# Patient Record
Sex: Female | Born: 1950 | Race: White | Hispanic: No | Smoking: Never smoker
Health system: Southern US, Community
[De-identification: ages and names within clinical notes are randomized; demographics above are authoritative.]

## PROBLEM LIST (undated history)

## (undated) DIAGNOSIS — T8859XA Other complications of anesthesia, initial encounter: Secondary | ICD-10-CM

## (undated) DIAGNOSIS — G629 Polyneuropathy, unspecified: Secondary | ICD-10-CM

## (undated) DIAGNOSIS — E119 Type 2 diabetes mellitus without complications: Secondary | ICD-10-CM

## (undated) DIAGNOSIS — M1990 Unspecified osteoarthritis, unspecified site: Secondary | ICD-10-CM

## (undated) DIAGNOSIS — T4145XA Adverse effect of unspecified anesthetic, initial encounter: Secondary | ICD-10-CM

## (undated) DIAGNOSIS — I10 Essential (primary) hypertension: Secondary | ICD-10-CM

## (undated) HISTORY — PX: CHOLECYSTECTOMY: SHX55

## (undated) HISTORY — PX: THUMB ARTHROSCOPY: SHX2509

## (undated) HISTORY — PX: SHOULDER ARTHROSCOPY: SHX128

---

## 2009-06-07 ENCOUNTER — Ambulatory Visit: Payer: Self-pay

## 2017-09-09 ENCOUNTER — Other Ambulatory Visit: Payer: Self-pay

## 2017-09-09 ENCOUNTER — Encounter: Payer: Self-pay | Admitting: *Deleted

## 2017-09-09 ENCOUNTER — Encounter
Admission: RE | Admit: 2017-09-09 | Discharge: 2017-09-09 | Disposition: A | Discharge status: Home / Self Care | Payer: Medicare Other | Source: Ambulatory Visit | Attending: Obstetrics & Gynecology | Admitting: Obstetrics & Gynecology

## 2017-09-09 DIAGNOSIS — R000 Tachycardia, unspecified: Secondary | ICD-10-CM | POA: Diagnosis not present

## 2017-09-09 DIAGNOSIS — Z01810 Encounter for preprocedural cardiovascular examination: Secondary | ICD-10-CM | POA: Insufficient documentation

## 2017-09-09 DIAGNOSIS — E1140 Type 2 diabetes mellitus with diabetic neuropathy, unspecified: Secondary | ICD-10-CM | POA: Diagnosis not present

## 2017-09-09 DIAGNOSIS — I10 Essential (primary) hypertension: Secondary | ICD-10-CM | POA: Insufficient documentation

## 2017-09-09 DIAGNOSIS — Z01812 Encounter for preprocedural laboratory examination: Secondary | ICD-10-CM | POA: Insufficient documentation

## 2017-09-09 DIAGNOSIS — Z87898 Personal history of other specified conditions: Secondary | ICD-10-CM | POA: Diagnosis not present

## 2017-09-09 HISTORY — DX: Unspecified osteoarthritis, unspecified site: M19.90

## 2017-09-09 HISTORY — DX: Type 2 diabetes mellitus without complications: E11.9

## 2017-09-09 HISTORY — DX: Adverse effect of unspecified anesthetic, initial encounter: T41.45XA

## 2017-09-09 HISTORY — DX: Other complications of anesthesia, initial encounter: T88.59XA

## 2017-09-09 HISTORY — DX: Polyneuropathy, unspecified: G62.9

## 2017-09-09 HISTORY — DX: Essential (primary) hypertension: I10

## 2017-09-09 LAB — TYPE AND SCREEN
ABO/RH(D): A POS
ANTIBODY SCREEN: NEGATIVE

## 2017-09-09 LAB — CBC
HCT: 36 % (ref 35.0–47.0)
HEMOGLOBIN: 11.4 g/dL — AB (ref 12.0–16.0)
MCH: 26.5 pg (ref 26.0–34.0)
MCHC: 31.7 g/dL — ABNORMAL LOW (ref 32.0–36.0)
MCV: 83.6 fL (ref 80.0–100.0)
Platelets: 283 10*3/uL (ref 150–440)
RBC: 4.3 MIL/uL (ref 3.80–5.20)
RDW: 14.5 % (ref 11.5–14.5)
WBC: 8.1 10*3/uL (ref 3.6–11.0)

## 2017-09-09 LAB — BASIC METABOLIC PANEL
ANION GAP: 11 (ref 5–15)
BUN: 31 mg/dL — ABNORMAL HIGH (ref 6–20)
CHLORIDE: 103 mmol/L (ref 101–111)
CO2: 25 mmol/L (ref 22–32)
Calcium: 9.5 mg/dL (ref 8.9–10.3)
Creatinine, Ser: 0.98 mg/dL (ref 0.44–1.00)
GFR calc Af Amer: >60 mL/min (ref >60)
GFR calc non Af Amer: 59 mL/min — ABNORMAL LOW (ref >60)
Glucose, Bld: 232 mg/dL — ABNORMAL HIGH (ref 65–99)
POTASSIUM: 4.4 mmol/L (ref 3.5–5.1)
SODIUM: 139 mmol/L (ref 135–145)

## 2017-09-09 NOTE — Patient Instructions (Addendum)
Your procedure is scheduled on: 09/18/17 Wed Report to Same Day Surgery 2nd floor medical mall [facility](Medical Mall Entrance-take elevator on left to 2nd floor.  Check in with surgery information desk.) To find out your arrival time please call [phone] between 1PM - 3PM on 09/17/17 Tues  Remember: Instructions that are not followed completely may result in serious medical risk, up to and including death, or upon the discretion of your surgeon and anesthesiologist your surgery may need to be rescheduled.    _x___ 1. Do not eat food after midnight the night before your procedure. You may drink clear liquids up to 2 hours before you are scheduled to arrive at the hospital for your procedure.  Do not drink clear liquids within 2 hours of your scheduled arrival to the hospital.  Clear liquids include  --Water or Apple juice without pulp  --Clear carbohydrate beverage such as ClearFast or Gatorade  --Black Coffee or Clear Tea (No milk, no creamers, do not add anything to                  the coffee or Tea Type 1 and type 2 diabetics should only drink water.  No gum chewing or hard candies.     __x__ 2. No Alcohol for 24 hours before or after surgery.   __x__3. No Smoking for 24 prior to surgery.   ____  4. Bring all medications with you on the day of surgery if instructed.    __x__ 5. Notify your doctor if there is any change in your medical condition     (cold, fever, infections).     Do not wear jewelry, make-up, hairpins, clips or nail polish.  Do not wear lotions, powders, or perfumes. You may wear deodorant.  Do not shave 48 hours prior to surgery. Men may shave face and neck.  Do not bring valuables to the hospital.    [facility] not [facility] for any belongings or valuables.               Contacts, dentures or bridgework may not be worn into surgery.  Leave your suitcase in the car. After surgery it may be brought to your room.  For patients admitted to the hospital,  discharge time is determined by your                       treatment team.   Patients discharged the day of surgery will not be allowed to drive home.  You will need someone to drive you home and stay with you the night of your procedure.    Please read over the following fact sheets that you were given:   [facility] and or MRSA Information   _x___ Take anti-hypertensive listed below, cardiac, seizure, asthma,     anti-reflux and psychiatric medicines. These include:  1. Omeprazole  2.  3.  4.  5.  6.  ____Fleets enema or Magnesium Citrate as directed.   _x___ Use CHG Soap or sage wipes as directed on instruction sheet   ____ Use inhalers on the day of surgery and bring to hospital day of surgery  _x___ Stop Metformin and Janumet 2 days prior to surgery.    ____ Take 1/2 of usual insulin dose the night before surgery and none on the morning     surgery.   _x___ Follow recommendations from Cardiologist, Pulmonologist or PCP regarding  stopping Aspirin, Coumadin, Plavix ,Eliquis, Effient, or Pradaxa, and Pletal.  X____Stop Anti-inflammatories such as Advil, Aleve, Ibuprofen, Motrin, Naproxen, Naprosyn, Goodies powders or aspirin products. OK to take Tylenol and                          Celebrex.   _x___ Stop supplements until after surgery.  But may continue Vitamin D, Vitamin B,       and multivitamin.   ____ Bring C-Pap to the hospital.

## 2017-09-10 NOTE — Pre-Procedure Instructions (Signed)
EKG CALLED AND FAXED TO [facility]ACE[facility] AT DR Judie PetitM MILLER,S. ALSO CALLED AND FAXED TO BECKY LYNN AT DR C WARD AS INSTRUCTED BY DR [facility] Henrene HawkingKEPHART

## 2017-09-12 NOTE — Pre-Procedure Instructions (Signed)
CLEARED LOW RISK BY DR Paul HalfM MILLER

## 2017-09-18 ENCOUNTER — Encounter
Admission: RE | Disposition: A | Discharge status: Home / Self Care | Payer: Self-pay | Source: Ambulatory Visit | Attending: Obstetrics & Gynecology

## 2017-09-18 ENCOUNTER — Other Ambulatory Visit: Payer: Self-pay

## 2017-09-18 ENCOUNTER — Ambulatory Visit: Payer: Medicare Other | Admitting: Registered Nurse

## 2017-09-18 ENCOUNTER — Ambulatory Visit
Admission: RE | Admit: 2017-09-18 | Discharge: 2017-09-18 | Disposition: A | Discharge status: Home / Self Care | Payer: Medicare Other | Source: Ambulatory Visit | Attending: Obstetrics & Gynecology | Admitting: Obstetrics & Gynecology

## 2017-09-18 DIAGNOSIS — I10 Essential (primary) hypertension: Secondary | ICD-10-CM | POA: Diagnosis not present

## 2017-09-18 DIAGNOSIS — Z888 Allergy status to other drugs, medicaments and biological substances status: Secondary | ICD-10-CM | POA: Insufficient documentation

## 2017-09-18 DIAGNOSIS — N840 Polyp of corpus uteri: Secondary | ICD-10-CM | POA: Insufficient documentation

## 2017-09-18 DIAGNOSIS — Z79899 Other long term (current) drug therapy: Secondary | ICD-10-CM | POA: Diagnosis not present

## 2017-09-18 DIAGNOSIS — E1140 Type 2 diabetes mellitus with diabetic neuropathy, unspecified: Secondary | ICD-10-CM | POA: Insufficient documentation

## 2017-09-18 DIAGNOSIS — N950 Postmenopausal bleeding: Secondary | ICD-10-CM | POA: Diagnosis not present

## 2017-09-18 DIAGNOSIS — Z882 Allergy status to sulfonamides status: Secondary | ICD-10-CM | POA: Insufficient documentation

## 2017-09-18 DIAGNOSIS — Z91040 Latex allergy status: Secondary | ICD-10-CM | POA: Insufficient documentation

## 2017-09-18 HISTORY — PX: HYSTEROSCOPY WITH D & C: SHX1775

## 2017-09-18 LAB — GLUCOSE, CAPILLARY
GLUCOSE-CAPILLARY: 193 mg/dL — AB (ref 65–99)
Glucose-Capillary: 209 mg/dL — ABNORMAL HIGH (ref 65–99)

## 2017-09-18 LAB — ABO/RH: ABO/RH(D): A POS

## 2017-09-18 SURGERY — DILATATION AND CURETTAGE /HYSTEROSCOPY
Anesthesia: General

## 2017-09-18 MED ORDER — DEXAMETHASONE SODIUM PHOSPHATE 10 MG/ML IJ SOLN
INTRAMUSCULAR | Status: DC | PRN
  Administered 2017-09-18: 5 mg via INTRAVENOUS

## 2017-09-18 MED ORDER — LIDOCAINE HCL (CARDIAC) 20 MG/ML IV SOLN
INTRAVENOUS | Status: DC | PRN
  Administered 2017-09-18: 80 mg via INTRAVENOUS

## 2017-09-18 MED ORDER — FENTANYL CITRATE (PF) 100 MCG/2ML IJ SOLN
INTRAMUSCULAR | Status: AC
  Administered 2017-09-18: 50 ug via INTRAVENOUS
  Filled 2017-09-18: qty 2

## 2017-09-18 MED ORDER — PROPOFOL 500 MG/50ML IV EMUL
INTRAVENOUS | Status: DC | PRN
  Administered 2017-09-18: 150 ug/kg/min via INTRAVENOUS

## 2017-09-18 MED ORDER — PROPOFOL 10 MG/ML IV BOLUS
INTRAVENOUS | Status: AC
  Filled 2017-09-18: qty 20

## 2017-09-18 MED ORDER — LIDOCAINE HCL (PF) 2 % IJ SOLN
INTRAMUSCULAR | Status: AC
  Filled 2017-09-18: qty 10

## 2017-09-18 MED ORDER — PROPOFOL 500 MG/50ML IV EMUL
INTRAVENOUS | Status: AC
  Filled 2017-09-18: qty 50

## 2017-09-18 MED ORDER — SUCCINYLCHOLINE CHLORIDE 20 MG/ML IJ SOLN
INTRAMUSCULAR | Status: AC
  Filled 2017-09-18: qty 1

## 2017-09-18 MED ORDER — FENTANYL CITRATE (PF) 100 MCG/2ML IJ SOLN
INTRAMUSCULAR | Status: DC | PRN
  Administered 2017-09-18: 50 ug via INTRAVENOUS

## 2017-09-18 MED ORDER — ONDANSETRON HCL 4 MG/2ML IJ SOLN: 4.0000 mg | Freq: Once | INTRAMUSCULAR | Status: DC | PRN

## 2017-09-18 MED ORDER — ONDANSETRON HCL 4 MG/2ML IJ SOLN
INTRAMUSCULAR | Status: DC | PRN
  Administered 2017-09-18: 4 mg via INTRAVENOUS

## 2017-09-18 MED ORDER — SODIUM CHLORIDE 0.9 % IV SOLN
INTRAVENOUS | Status: DC
  Administered 2017-09-18: 06:00:00 via INTRAVENOUS

## 2017-09-18 MED ORDER — FENTANYL CITRATE (PF) 100 MCG/2ML IJ SOLN
INTRAMUSCULAR | Status: AC
  Filled 2017-09-18: qty 2

## 2017-09-18 MED ORDER — MIDAZOLAM HCL 2 MG/2ML IJ SOLN
INTRAMUSCULAR | Status: DC | PRN
  Administered 2017-09-18: 2 mg via INTRAVENOUS

## 2017-09-18 MED ORDER — MIDAZOLAM HCL 2 MG/2ML IJ SOLN
INTRAMUSCULAR | Status: AC
  Filled 2017-09-18: qty 2

## 2017-09-18 MED ORDER — FENTANYL CITRATE (PF) 100 MCG/2ML IJ SOLN
25.0000 ug | INTRAMUSCULAR | Status: DC | PRN
  Administered 2017-09-18: 50 ug via INTRAVENOUS

## 2017-09-18 MED ORDER — PROPOFOL 10 MG/ML IV BOLUS
INTRAVENOUS | Status: DC | PRN
  Administered 2017-09-18: 200 mg via INTRAVENOUS

## 2017-09-18 SURGICAL SUPPLY — 18 items
BAG INFUSER PRESSURE 100CC (MISCELLANEOUS) ×2 IMPLANT
DEVICE MYOSURE LITE (MISCELLANEOUS) ×2 IMPLANT
ELECT REM PT RETURN 9FT ADLT (ELECTROSURGICAL) ×2
ELECTRODE REM PT RTRN 9FT ADLT (ELECTROSURGICAL) ×1 IMPLANT
GLOVE PI ORTHOPRO 6.5 (GLOVE) ×1
GLOVE PI ORTHOPRO STRL 6.5 (GLOVE) ×1 IMPLANT
GLOVE SURG SYN 6.5 ES PF (GLOVE) ×2 IMPLANT
GOWN STRL REUS W/ TWL LRG LVL3 (GOWN DISPOSABLE) ×2 IMPLANT
GOWN STRL REUS W/TWL LRG LVL3 (GOWN DISPOSABLE) ×2
IV LACTATED RINGERS 1000ML (IV SOLUTION) ×2 IMPLANT
KIT RM TURNOVER CYSTO AR (KITS) ×2 IMPLANT
NEEDLE SPNL 22GX3.5 QUINCKE BK (NEEDLE) ×2 IMPLANT
PACK DNC HYST (MISCELLANEOUS) ×2 IMPLANT
PAD OB MATERNITY 4.3X12.25 (PERSONAL CARE ITEMS) ×2 IMPLANT
PAD PREP 24X41 OB/GYN DISP (PERSONAL CARE ITEMS) ×2 IMPLANT
SEAL ROD LENS SCOPE MYOSURE (ABLATOR) ×2 IMPLANT
SYR 10ML LL (SYRINGE) ×2 IMPLANT
TUBING CONNECTING 10 (TUBING) ×2 IMPLANT

## 2017-09-18 NOTE — Anesthesia Procedure Notes (Signed)
Procedure Name: LMA Insertion Date/Time: 09/18/2017 7:57 AM Performed by: Karoline CaldwellStarr, Conda Wannamaker, CRNA Pre-anesthesia Checklist: Patient identified, Patient being monitored, Timeout performed, Emergency Drugs available and Suction available Patient Re-evaluated:Patient Re-evaluated prior to induction Oxygen Delivery Method: Circle system utilized Preoxygenation: Pre-oxygenation with 100% oxygen Induction Type: IV induction Ventilation: Mask ventilation without difficulty LMA: LMA inserted LMA Size: 4.0 Tube type: Oral Number of attempts: 1 Placement Confirmation: positive ETCO2 and breath sounds checked- equal and bilateral Tube secured with: Tape Dental Injury: Teeth and Oropharynx as per pre-operative assessment  Comments: Patient reports right upper bridge is slightly loose.  Remains intact after LMA insertion

## 2017-09-18 NOTE — Anesthesia Postprocedure Evaluation (Signed)
Anesthesia Post Note  Patient: Jamie Guzman  Procedure(s) Performed: DILATATION AND CURETTAGE /HYSTEROSCOPY, POLYPECTOMY (N/A )  Patient location during evaluation: PACU Anesthesia Type: General Level of consciousness: awake and alert Pain management: pain level controlled Vital Signs Assessment: post-procedure vital signs reviewed and stable Respiratory status: spontaneous breathing, nonlabored ventilation, respiratory function stable and patient connected to nasal cannula oxygen Cardiovascular status: blood pressure returned to baseline and stable Postop Assessment: no apparent nausea or vomiting Anesthetic complications: no     Last Vitals:  Vitals:   09/18/17 0914 09/18/17 0930  BP: 108/72 126/67  Pulse: 63 60  Resp: 10 14  Temp:  (!) 35.8 C  SpO2: 98% 100%    Last Pain:  Vitals:   09/18/17 0930  TempSrc: Temporal  PainSc:                  Lenard SimmerAndrew Jyra Lagares

## 2017-09-18 NOTE — H&P (Signed)
Preoperative History and Physical  Jamie BucklerValerie Lynne Guzman is a 66 y.o. with post menopausal bleeding, thickened endometrium and inability to sample endometrial tissue.  Proposed surgery: hysteroscopy, dilation and curettage   Past Medical History:  Diagnosis Date  . Arthritis   . Complication of anesthesia    nausea and vomiting "no gas"  . Diabetes mellitus without complication (HCC)   . Hypertension   . Neuropathy    Past Surgical History:  Procedure Laterality Date  . CHOLECYSTECTOMY    . SHOULDER ARTHROSCOPY    . THUMB ARTHROSCOPY     OB History  No data available  Patient denies any other pertinent gynecologic issues.   No current facility-administered medications on file prior to encounter.    No current outpatient medications on file prior to encounter.   Allergies  Allergen Reactions  . Codeine Nausea And Vomiting  . Demerol [Meperidine] Nausea And Vomiting  . Iodine   . Lasix [Furosemide] Other (See Comments)    Muscle cramps  . Sulfa Antibiotics Other (See Comments)    Itching   . Adhesive [Tape] Rash  . Latex Rash    Social History:   reports that  has never smoked. she has never used smokeless tobacco. She reports that she does not drink alcohol or use drugs.  History reviewed. No pertinent family history.  Review of Systems: Noncontributory  PHYSICAL EXAM: Blood pressure 131/64, pulse 83, temperature 97.7 F (36.5 C), temperature source Temporal, resp. rate 18, weight 90.7 kg (200 lb), SpO2 98 %. General appearance - alert, well appearing, and in no distress Chest - clear to auscultation, no wheezes, rales or rhonchi, symmetric air entry Heart - normal rate and regular rhythm Abdomen - soft, nontender, nondistended, no masses or organomegaly Pelvic - examination not indicated Extremities - peripheral pulses normal, no pedal edema, no clubbing or cyanosis  Labs: Results for orders placed or performed during the hospital encounter of 09/18/17 (from  the past 336 hour(s))  Glucose, capillary   Collection Time: 09/18/17  6:06 AM  Result Value Ref Range   Glucose-Capillary 193 (H) 65 - 99 mg/dL  ABO/Rh   Collection Time: 09/18/17  6:28 AM  Result Value Ref Range   ABO/RH(D) A POS   Results for orders placed or performed during the hospital encounter of 09/09/17 (from the past 336 hour(s))  CBC   Collection Time: 09/09/17  1:38 PM  Result Value Ref Range   WBC 8.1 3.6 - 11.0 K/uL   RBC 4.30 3.80 - 5.20 MIL/uL   Hemoglobin 11.4 (L) 12.0 - 16.0 g/dL   HCT 16.136.0 09.635.0 - 04.547.0 %   MCV 83.6 80.0 - 100.0 fL   MCH 26.5 26.0 - 34.0 pg   MCHC 31.7 (L) 32.0 - 36.0 g/dL   RDW 40.914.5 81.111.5 - 91.414.5 %   Platelets 283 150 - 440 K/uL  Basic metabolic panel   Collection Time: 09/09/17  1:38 PM  Result Value Ref Range   Sodium 139 135 - 145 mmol/L   Potassium 4.4 3.5 - 5.1 mmol/L   Chloride 103 101 - 111 mmol/L   CO2 25 22 - 32 mmol/L   Glucose, Bld 232 (H) 65 - 99 mg/dL   BUN 31 (H) 6 - 20 mg/dL   Creatinine, Ser 7.820.98 0.44 - 1.00 mg/dL   Calcium 9.5 8.9 - 95.610.3 mg/dL   GFR calc non Af Amer 59 (L) >60 mL/min   GFR calc Af Amer >60 >60 mL/min   Anion gap  11 5 - 15  Type and screen [facility]   Collection Time: 09/09/17  1:38 PM  Result Value Ref Range   ABO/RH(D) A POS    Antibody Screen NEG    Sample Expiration 09/23/2017    Extend sample reason NO TRANSFUSIONS OR PREGNANCY IN THE PAST 3 MONTHS     Imaging Studies: TVUS: uterus 6.5 x 4 x 4 with EE of 1.5cm      Assessment: Post menopausal bleeding  Plan: Patient will undergo surgical management with hysteroscopy D&C.   The risks of surgery were discussed in detail with the patient including but not limited to: bleeding which may require transfusion or reoperation; infection which may require antibiotics; injury to surrounding organs which may involve bowel, bladder, ureters ; need for additional procedures including laparoscopy or laparotomy; thromboembolic  phenomenon, surgical site problems and other postoperative/anesthesia complications. Likelihood of success in alleviating the patient's condition was discussed. Routine postoperative instructions will be reviewed with the patient and her family in detail after surgery.  The patient concurred with the proposed plan, giving informed written consent for the surgery.  Patient has been NPO since last night she will remain NPO for procedure.  Anesthesia and OR aware.    ----- Ranae Plumberhelsea Margorie Renner, MD Attending Obstetrician and Gynecologist [facility], Department of OB/GYN [facility]

## 2017-09-18 NOTE — Transfer of Care (Signed)
Immediate Anesthesia Transfer of Care Note  Patient: Jamie Guzman  Procedure(s) Performed: DILATATION AND CURETTAGE /HYSTEROSCOPY, POLYPECTOMY (N/A )  Patient Location: PACU  Anesthesia Type:General  Level of Consciousness: awake, alert  and oriented  Airway & Oxygen Therapy: Patient Spontanous Breathing and Patient connected to face mask oxygen  Post-op Assessment: Report given to RN and Post -op Vital signs reviewed and stable  Post vital signs: Reviewed and stable  Last Vitals:  Vitals:   09/18/17 0606 09/18/17 0844  BP: 131/64 119/63  Pulse: 83 91  Resp: 18 (!) 25  Temp: 36.5 C (!) 36.1 C  SpO2: 98% 99%    Last Pain:  Vitals:   09/18/17 0606  TempSrc: Temporal         Complications: No apparent anesthesia complications

## 2017-09-18 NOTE — Anesthesia Preprocedure Evaluation (Signed)
Anesthesia Evaluation  Patient identified by MRN, date of birth, ID band Patient awake    Reviewed: Allergy & Precautions, H&P , NPO status , Patient's Chart, lab work & pertinent test results, reviewed documented beta blocker date and time   History of Anesthesia Complications (+) PONV and history of anesthetic complications  Airway Mallampati: III  TM Distance: >3 FB Neck ROM: full    Dental  (+) Loose, Dental Advidsory Given Permanent bridge in front that is loose:   Pulmonary neg pulmonary ROS,           Cardiovascular Exercise Tolerance: Good hypertension, (-) angina(-) CAD, (-) Past MI, (-) Cardiac Stents and (-) CABG (-) dysrhythmias (-) Valvular Problems/Murmurs     Neuro/Psych negative neurological ROS  negative psych ROS   GI/Hepatic Neg liver ROS, GERD  ,  Endo/Other  diabetes, Well Controlled, Oral Hypoglycemic Agents  Renal/GU negative Renal ROS  negative genitourinary   Musculoskeletal   Abdominal   Peds  Hematology negative hematology ROS (+)   Anesthesia Other Findings Past Medical History: No date: Arthritis No date: Complication of anesthesia     Comment:  nausea and vomiting "no gas" No date: Diabetes mellitus without complication (HCC) No date: Hypertension No date: Neuropathy   Reproductive/Obstetrics negative OB ROS                             Anesthesia Physical Anesthesia Plan  ASA: III  Anesthesia Plan: General   Post-op Pain Management:    Induction: Intravenous  PONV Risk Score and Plan: 4 or greater and Propofol infusion, Ondansetron and Dexamethasone  Airway Management Planned: LMA  Additional Equipment:   Intra-op Plan:   Post-operative Plan: Extubation in OR  Informed Consent: I have reviewed the patients History and Physical, chart, labs and discussed the procedure including the risks, benefits and alternatives for the proposed anesthesia  with the patient or authorized representative who has indicated his/her understanding and acceptance.   Dental Advisory Given  Plan Discussed with: Anesthesiologist, CRNA and Surgeon  Anesthesia Plan Comments:         Anesthesia Quick Evaluation

## 2017-09-18 NOTE — Op Note (Signed)
Operative Report Hysteroscopy, Dilation and Curettage 09/18/2017  Patient:  Jamie Guzman  66 y.o. female Preoperative diagnosis:  Thickened endometrium, PMB Postoperative diagnosis:  Endometrial Polyp  PMB  PROCEDURE:  Procedure(s): DILATATION AND CURETTAGE /HYSTEROSCOPY, POLYPECTOMY (N/A) Surgeon:  Surgeon(s) and Role:    * Gustavus Haskin, Honor Loh, MD - Primary Anesthesia:  MAC I/O: Total I/O In: 800 [I.V.:800] Out: 5 [Blood:5] Specimens:  Endometrial curettings, Endometrial polyp Complications: None Apparent Disposition:  VS stable to PACU  Findings: Uterus, mobile, retroverted, normal size, sounding to 6cm; normal cervix, narrow vagina, perineum. Tissue protruding from cervix.  Hysteroscopic findings:  Polyps from anterior and left lateral walls.  Indication for procedure/Consents: 66 y.o. here for scheduled surgery for the aforementioned diagnoses.  Risks of surgery were discussed with the patient including but not limited to: bleeding which may require transfusion; infection which may require antibiotics; injury to uterus or surrounding organs; intrauterine scarring which may impair future fertility; need for additional procedures including laparotomy or laparoscopy; and other postoperative/anesthesia complications. Written informed consent was obtained.    Procedure Details:   The patient was then taken to the operating room where anesthesia was administered and was found to be adequate.  After a formal and adequate timeout was performed, she was placed in the dorsal lithotomy position and examined with the above findings. She was then prepped and draped in the sterile manner.  A speculum was then placed in the patient's vagina and a single tooth tenaculum was applied to the anterior lip of the cervix.  The tissue protruding from the cervix was grasped with polyp forceps and gently removed. The uterus was sounded to 6 cm. Her cervix was serially dilated to accommodate the myoscope,  with findings as above. Polyp forceps were used to extract portions of the polyps. A sharp curettage was then performed until there was a gritty texture in all four quadrants.  The bases of the two polyps were still present, thus the [facility] device was employed to extract the remaining tissue.  The bases were cleared.  The specimens were handed off to nursing.  The tenaculum was removed from the anterior lip of the cervix and the vaginal speculum was removed after noting good hemostasis. The patient tolerated the procedure well and was taken to the recovery area awake, extubated and in stable condition.  The patient will be discharged to home as per PACU criteria.  Routine postoperative instructions given. She will follow up in the clinic in two to four weeks for postoperative evaluation.  Larey Days, MD [facility] OBGYN Attending Gynecologist

## 2017-09-18 NOTE — Anesthesia Post-op Follow-up Note (Signed)
Anesthesia QCDR form completed.        

## 2017-09-18 NOTE — Discharge Instructions (Signed)
You should expect to have some cramping and vaginal bleeding for about a week. This should taper off and subside, much like a period. If heavy bleeding continues or gets worse, you should contact the office for an earlier appointment.   Tylenol, ibuprofen and heating pad as needed for cramping.  Please call the office or physician on call for fever >101, severe pain, and heavy bleeding.   [phone]  NOTHING IN THE VAGINA FOR 2 WEEKS!!

## 2017-09-21 LAB — SURGICAL PATHOLOGY

## 2020-02-10 ENCOUNTER — Other Ambulatory Visit: Payer: Self-pay

## 2020-02-10 ENCOUNTER — Ambulatory Visit: Payer: Medicare Other | Admitting: Dermatology

## 2020-02-10 DIAGNOSIS — L578 Other skin changes due to chronic exposure to nonionizing radiation: Secondary | ICD-10-CM

## 2020-02-10 DIAGNOSIS — L821 Other seborrheic keratosis: Secondary | ICD-10-CM | POA: Diagnosis not present

## 2020-02-10 NOTE — Progress Notes (Signed)
   Follow-Up Visit   Subjective  Jamie Guzman is a 69 y.o. adult who presents for the following: spot (Right breast, removed previously with biopsy, showed benign SK and is starting to come back. ).   The following portions of the chart were reviewed this encounter and updated as appropriate: Tobacco  Allergies  Meds  Problems  Med Hx  Surg Hx  Fam Hx      Review of Systems: No other skin or systemic complaints.  Objective  Well appearing patient in no apparent distress; mood and affect are within normal limits.  A focused examination was performed including chest. Relevant physical exam findings are noted in the Assessment and Plan.  Objective  Right lateral breast: Stuck-on, waxy, tan-brown papules and plaques -- Discussed benign etiology and prognosis.   Assessment & Plan  Seborrheic keratosis Right lateral breast  Benign-appearing.  Observation.  Call clinic for new or changing lesions.  Recommend daily use of broad spectrum spf 30+ sunscreen to sun-exposed areas.    Actinic Damage - diffuse scaly erythematous macules with underlying dyspigmentation - Recommend daily broad spectrum sunscreen SPF 30+ to sun-exposed areas, reapply every 2 hours as needed.  - Call for new or changing lesions.  Recommend taking Heliocare sun protection supplement daily in sunny weather for additional sun protection. For maximum protection on the sunniest days, you can take up to 2 capsules of regular Heliocare OR take 1 capsule of Heliocare Ultra. For prolonged exposure (such as a full day in the sun), you can repeat your dose of the supplement 4 hours after your first dose. Heliocare can be purchased at [facility] or at [email or web address].   Return as needed for new or changing lesions or concerns.  Anise Salvo, RMA, am acting as scribe for Darden Dates, MD .  Documentation: I have reviewed the above documentation for accuracy and completeness, and I agree with  the above.  Darden Dates, MD

## 2020-02-10 NOTE — Patient Instructions (Addendum)
Recommend daily broad spectrum sunscreen SPF 30+ to sun-exposed areas, reapply every 2 hours as needed. Call for new or changing lesions.  Recommend taking Heliocare sun protection supplement daily in sunny weather for additional sun protection. For maximum protection on the sunniest days, you can take up to 2 capsules of regular Heliocare OR take 1 capsule of Heliocare Ultra. For prolonged exposure (such as a full day in the sun), you can repeat your dose of the supplement 4 hours after your first dose. Heliocare can be purchased at [facility] or at [email or web address].   

## 2020-02-25 ENCOUNTER — Encounter: Payer: Self-pay | Admitting: Dermatology

## 2020-05-26 ENCOUNTER — Other Ambulatory Visit: Payer: Self-pay | Admitting: Internal Medicine

## 2020-05-26 DIAGNOSIS — M5010 Cervical disc disorder with radiculopathy, unspecified cervical region: Secondary | ICD-10-CM

## 2020-06-15 ENCOUNTER — Ambulatory Visit
Admission: RE | Admit: 2020-06-15 | Discharge: 2020-06-15 | Disposition: A | Discharge status: Home / Self Care | Payer: Medicare Other | Source: Ambulatory Visit | Attending: Internal Medicine | Admitting: Internal Medicine

## 2020-06-15 ENCOUNTER — Other Ambulatory Visit: Payer: Self-pay

## 2020-06-15 DIAGNOSIS — M5010 Cervical disc disorder with radiculopathy, unspecified cervical region: Secondary | ICD-10-CM | POA: Diagnosis present

## 2020-06-15 IMAGING — MR MR CERVICAL SPINE W/O CM
5 series · 40 of 48 positions shown · non-contrast
Comparison: None.

CLINICAL DATA: Neck pain radiating into both upper extremities

EXAM:
MRI CERVICAL SPINE WITHOUT CONTRAST
TECHNIQUE: Multiplanar, multisequence MR imaging of the cervical spine was
performed. No intravenous contrast was administered.

[Series 5: T2 · sagittal · 3.0mm · 0.62mm/px · 6 of 15 slices shown (1 of 2)]
[im 1/15]
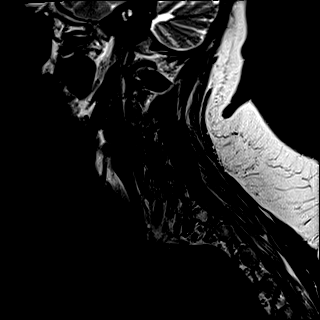
[im 3/15]
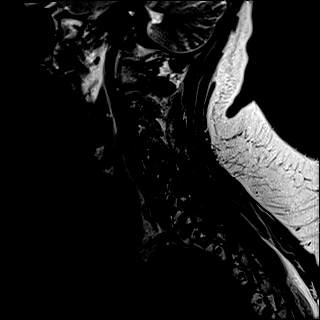
[im 6/15]
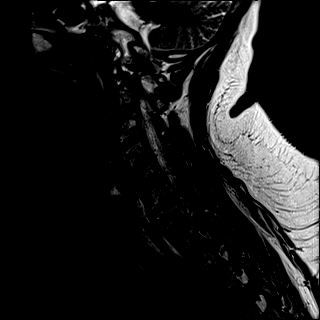
[im 9/15]
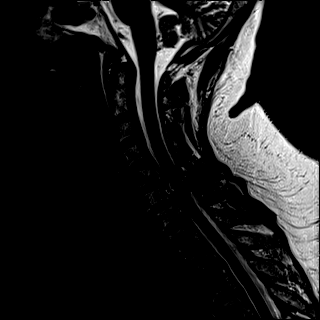
[im 12/15]
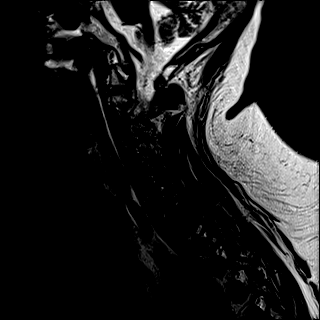
[im 15/15]
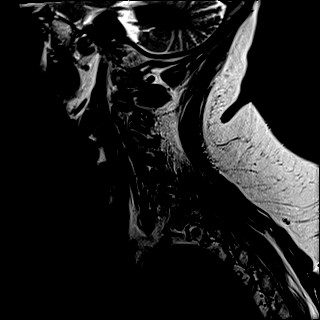

[Series 7: STIR · sagittal · 3.0mm · 0.62mm/px · 7 of 15 slices shown]
[im 1/15]
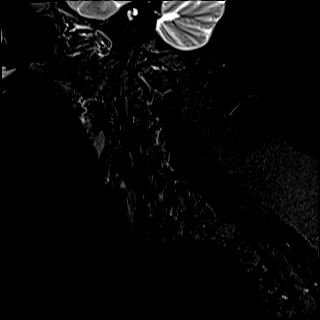
[im 3/15]
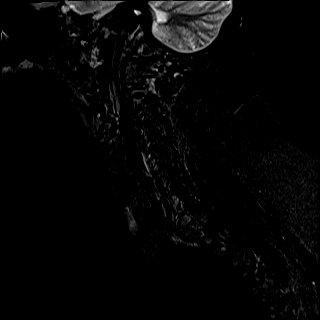
[im 5/15]
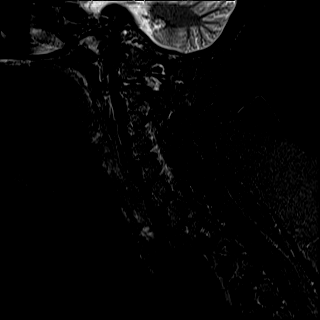
[im 8/15]
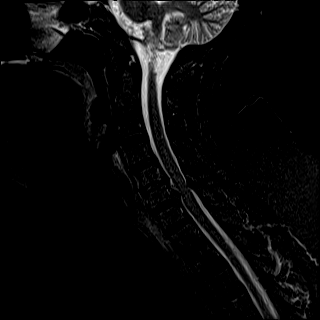
[im 10/15]
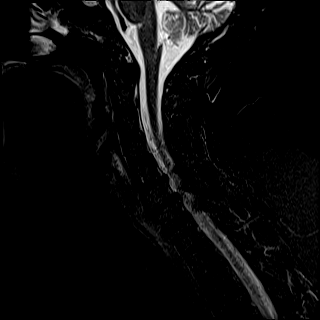
[im 12/15]
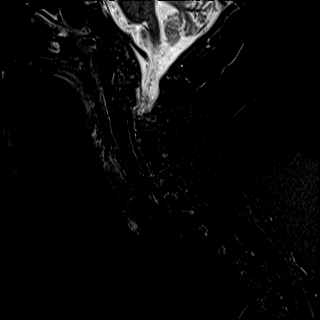
[im 15/15]
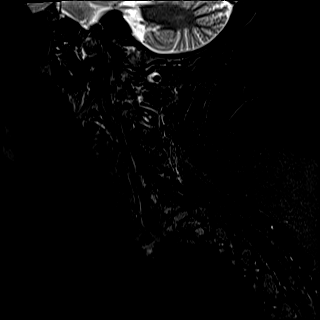

[Series 8: T2 · axial · 3.0mm · 0.70mm/px · z∈[-95,-0]mm · 12 of 29 slices shown (2 of 2)]
[im 1/29]
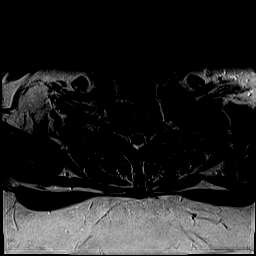
[im 3/29]
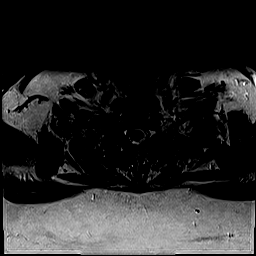
[im 5/29]
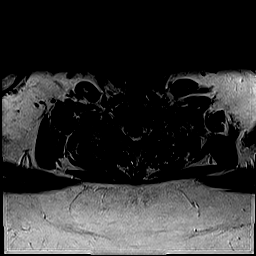
[im 7/29]
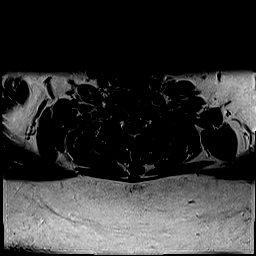
[im 9/29]
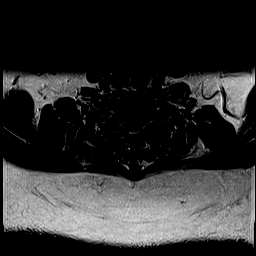
[im 11/29]
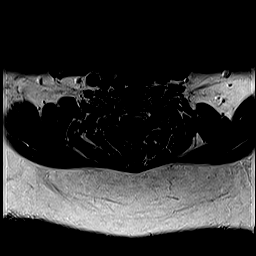
[im 13/29]
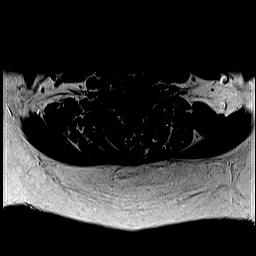
[im 16/29]
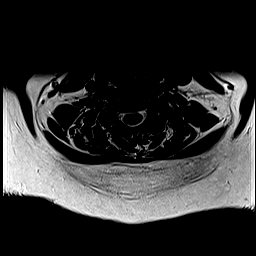
[im 18/29]
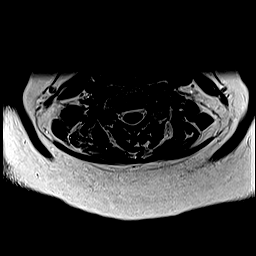
[im 20/29]
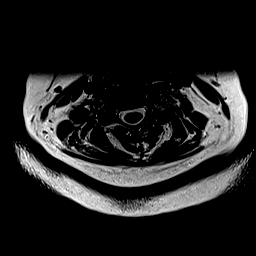
[im 24/29]
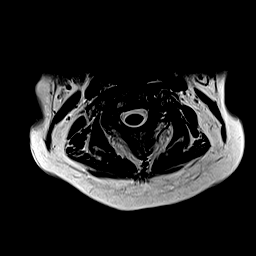
[im 29/29]
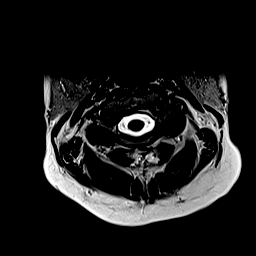

[Series 9: ax mpgr · axial · 3.0mm · 0.35mm/px · z∈[-95,-0]mm · 8 of 29 slices shown]
[im 1/29]
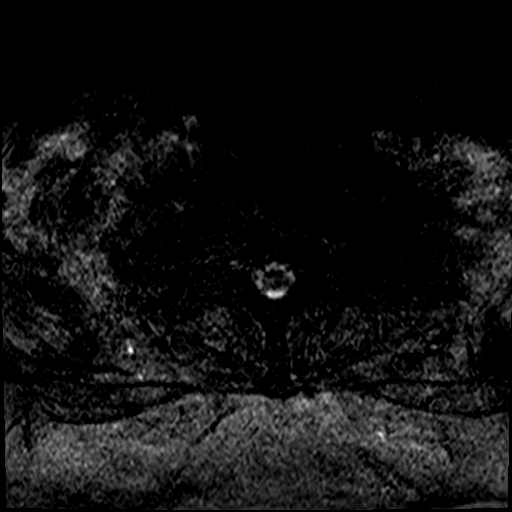
[im 5/29]
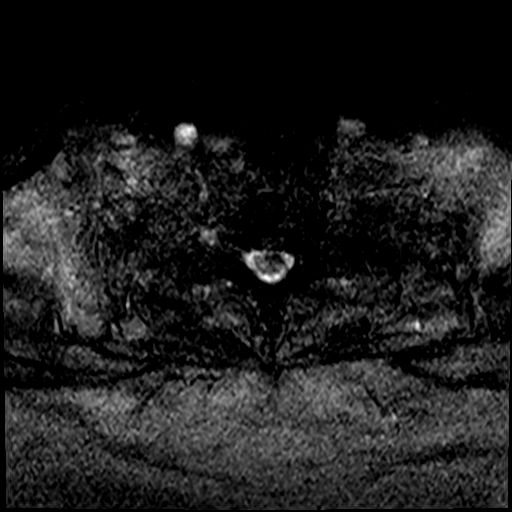
[im 9/29]
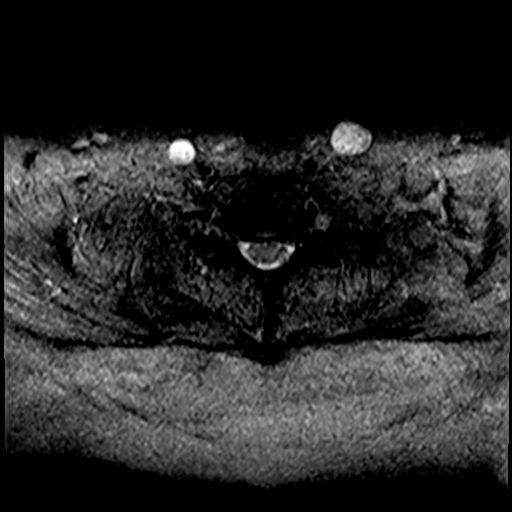
[im 13/29]
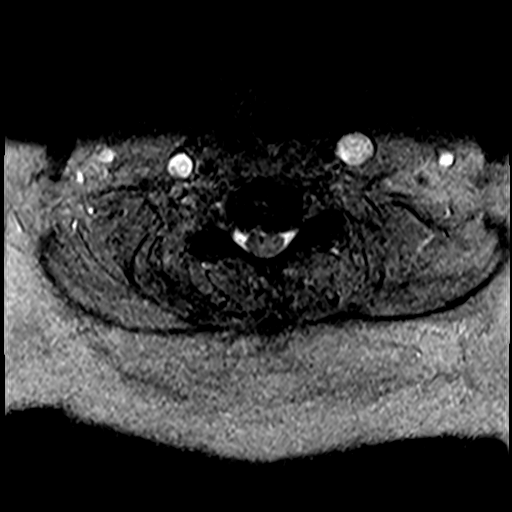
[im 16/29]
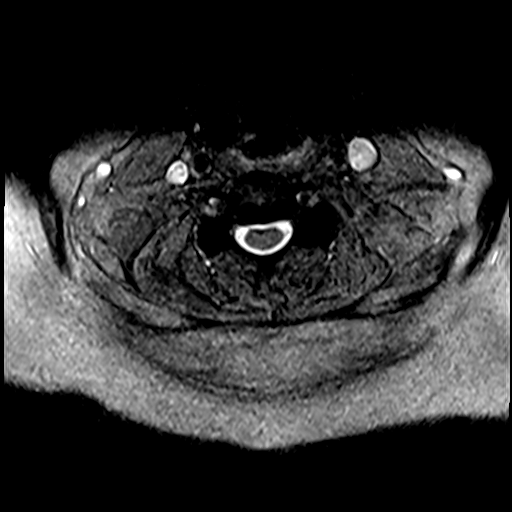
[im 20/29]
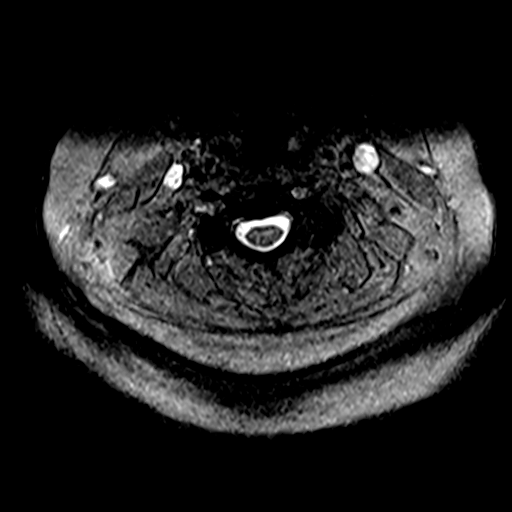
[im 24/29]
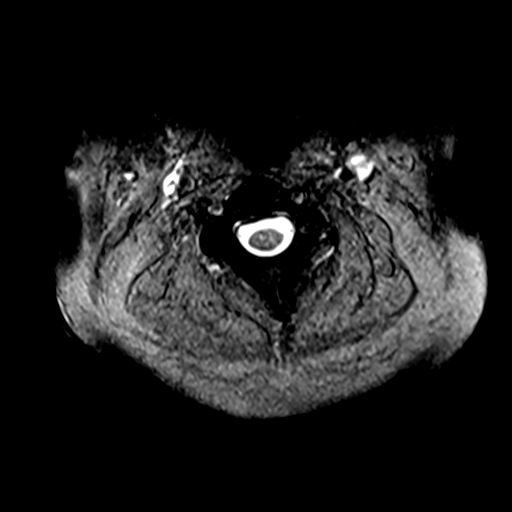
[im 29/29]
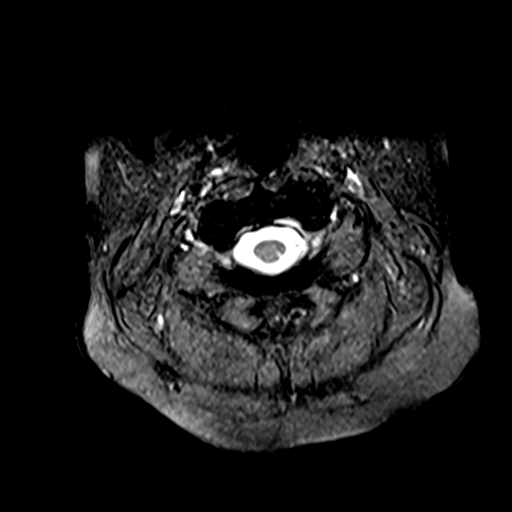

[Series 10: FLAIR · sagittal · 3.0mm · 0.78mm/px · 7 of 15 slices shown]
[im 1/15]
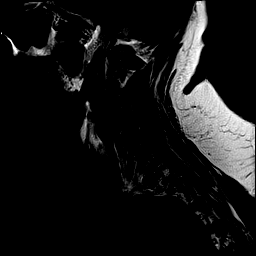
[im 3/15]
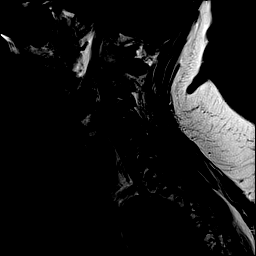
[im 5/15]
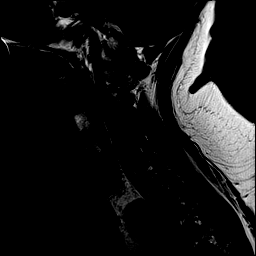
[im 8/15]
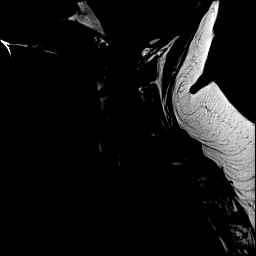
[im 10/15]
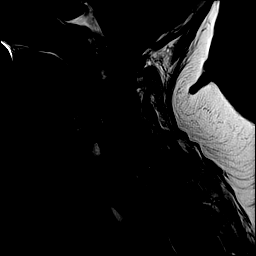
[im 12/15]
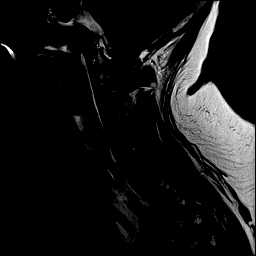
[im 15/15]
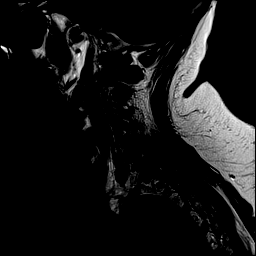

[40 of 48 positions shown; findings below may reference images not displayed]

FINDINGS: Alignment: Physiologic.

Vertebrae: No fracture, evidence of discitis, or bone lesion.

Cord: Normal signal and morphology.

Posterior Fossa, vertebral arteries, paraspinal tissues: Negative

Disc levels:

C2-3: Small right subarticular disc protrusion without stenosis.

C3-4: Moderate left facet hypertrophy moderate left foraminal
stenosis. No spinal canal stenosis or disc herniation.

C4-5: Left-greater-than-right facet hypertrophy with small right
uncovertebral osteophyte. Moderate left foraminal stenosis. No
spinal canal stenosis.

C5-6: Intermediate sized disc bulge with mild spinal canal stenosis.
Mild left foraminal stenosis.

C6-7: Intermediate disc bulge with moderate spinal canal stenosis.
No neural impingement.

C7-T1: Unremarkable.

Sagittal [HOSPITAL] T1-T4 is normal.
IMPRESSION: 1. Moderate spinal canal stenosis at C6-7 secondary to combination
of disc bulge and facet arthrosis.
2. Mild spinal canal stenosis at C5-6.
3. Moderate left C3-4 and C4-5 neural foraminal stenosis.

## 2020-08-24 ENCOUNTER — Ambulatory Visit: Admit: 2020-08-24 | Payer: Medicare Other | Admitting: Ophthalmology

## 2020-08-24 SURGERY — PHACOEMULSIFICATION, CATARACT, WITH IOL INSERTION
Anesthesia: Topical | Laterality: Right

## 2021-02-23 ENCOUNTER — Other Ambulatory Visit: Payer: Self-pay | Admitting: Student

## 2021-02-23 DIAGNOSIS — G8929 Other chronic pain: Secondary | ICD-10-CM

## 2021-02-23 DIAGNOSIS — M75101 Unspecified rotator cuff tear or rupture of right shoulder, not specified as traumatic: Secondary | ICD-10-CM

## 2021-03-10 ENCOUNTER — Other Ambulatory Visit: Payer: Self-pay

## 2021-03-10 ENCOUNTER — Ambulatory Visit
Admission: RE | Admit: 2021-03-10 | Discharge: 2021-03-10 | Disposition: A | Discharge status: Home / Self Care | Payer: Medicare Other | Source: Ambulatory Visit | Attending: Student | Admitting: Student

## 2021-03-10 DIAGNOSIS — G8929 Other chronic pain: Secondary | ICD-10-CM

## 2021-03-10 DIAGNOSIS — M75101 Unspecified rotator cuff tear or rupture of right shoulder, not specified as traumatic: Secondary | ICD-10-CM

## 2021-03-10 DIAGNOSIS — M25511 Pain in right shoulder: Secondary | ICD-10-CM | POA: Diagnosis present

## 2021-03-10 IMAGING — MR MR SHOULDER*R* W/O CM
4 of 5 series · 25 of 40 positions shown · non-contrast
Comparison: Right shoulder x-ray report dated [DATE].

CLINICAL DATA: Right shoulder pain radiating down the arm for the
past few weeks after heavy lifting injury.

EXAM:
MRI OF THE RIGHT SHOULDER WITHOUT CONTRAST
TECHNIQUE: Multiplanar, multisequence MR imaging of the shoulder was performed.
No intravenous contrast was administered.

[Series 5: T2 fat-sat · axial · right · 4.0mm · 0.44mm/px · z∈[-28,+79]mm · 8 of 26 slices shown (1 of 3)]
[im 1/26]
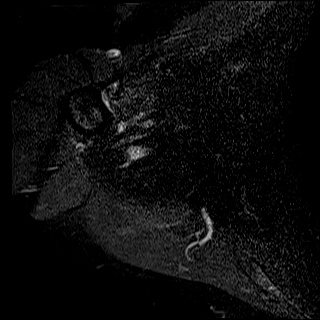
[im 3/26]
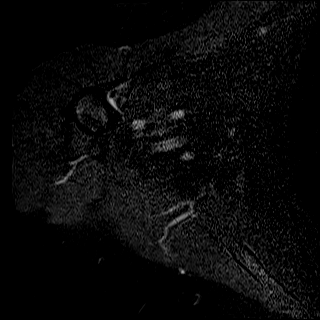
[im 9/26]
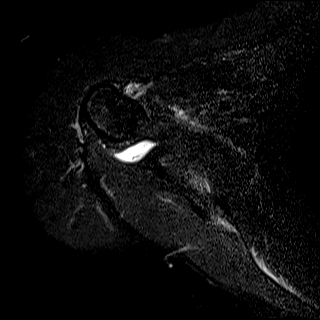
[im 12/26]
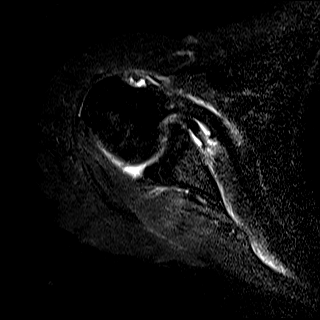
[im 14/26]
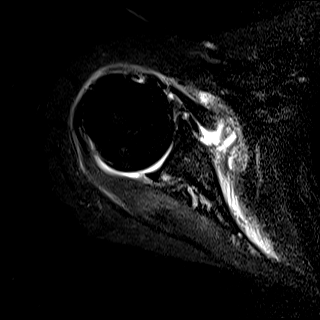
[im 17/26]
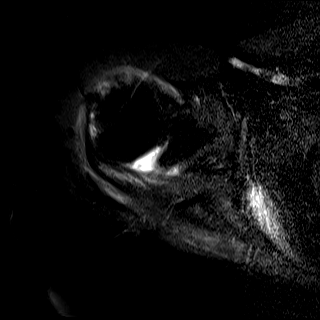
[im 23/26]
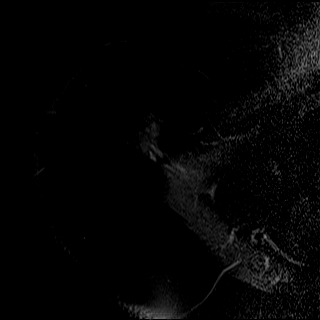
[im 26/26]
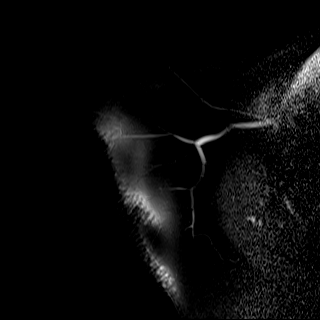

[Series 6: T2 fat-sat · oblique · right · 4.0mm · 0.22mm/px · 7 of 22 slices shown (2 of 3)]
[im 1/22]
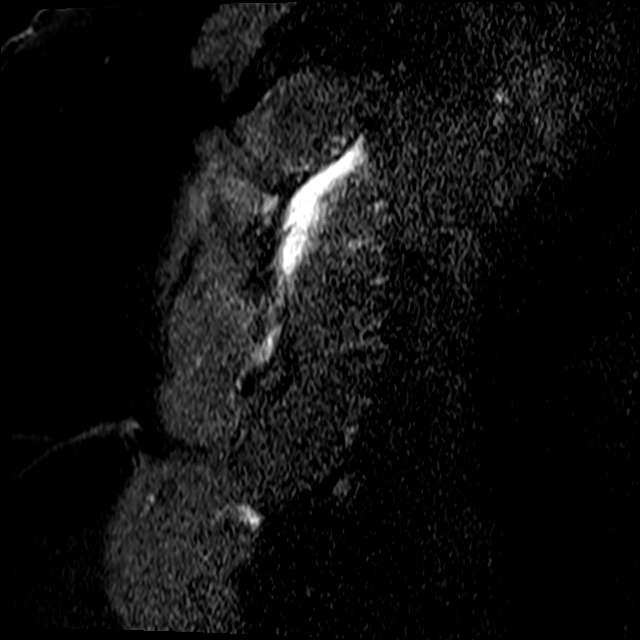
[im 4/22]
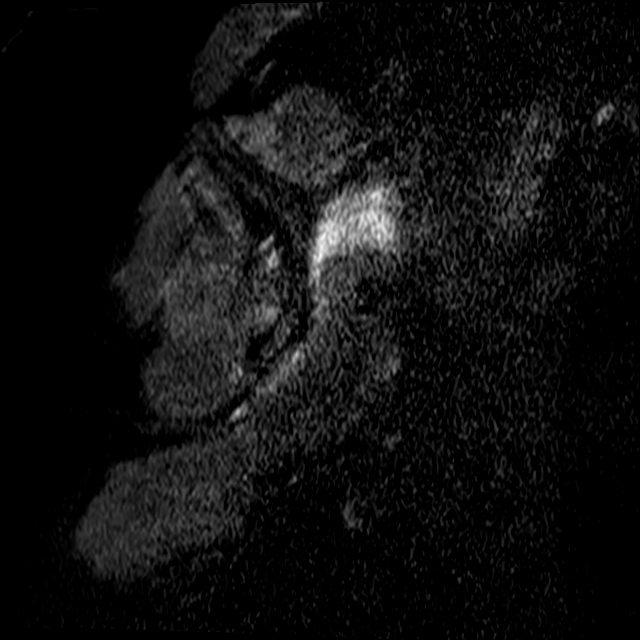
[im 7/22]
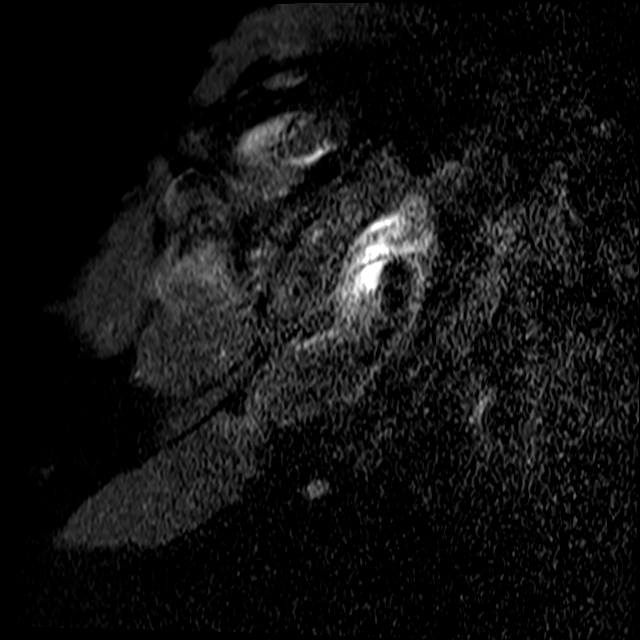
[im 10/22]
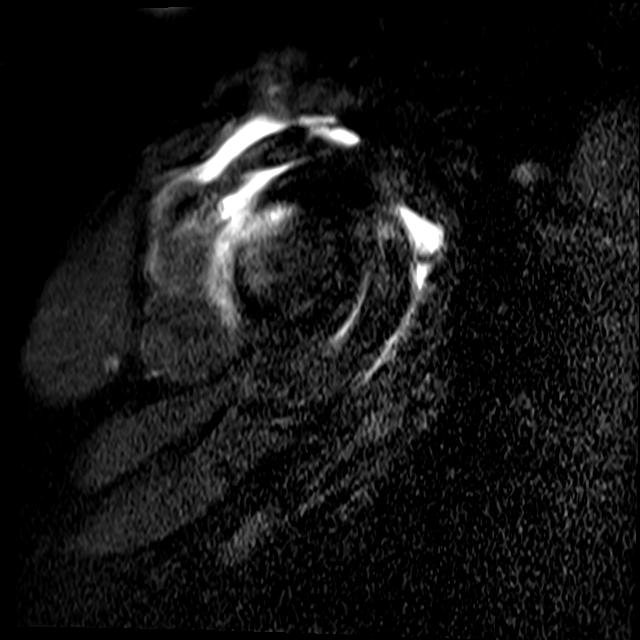
[im 13/22]
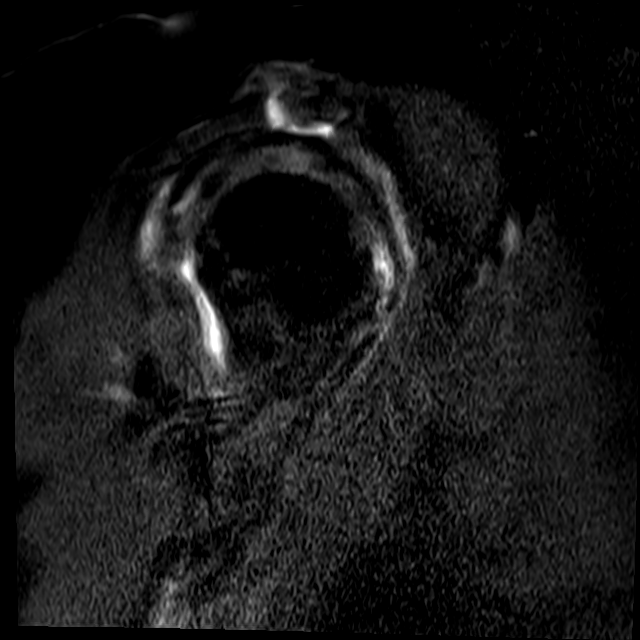
[im 16/22]
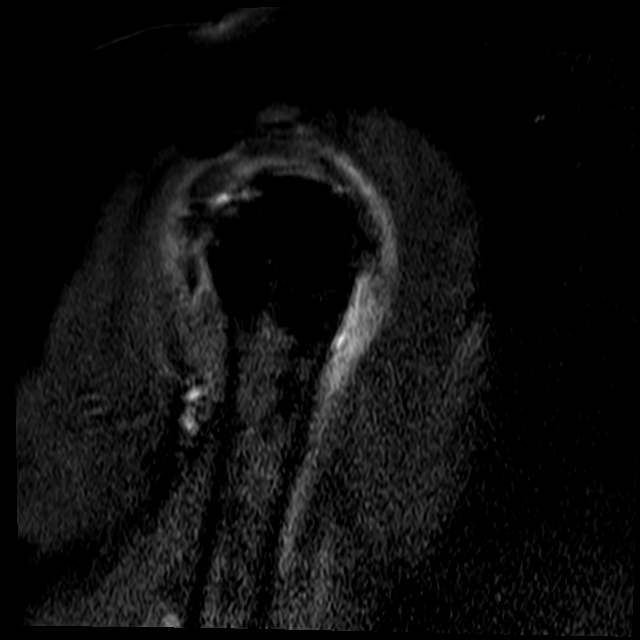
[im 19/22]
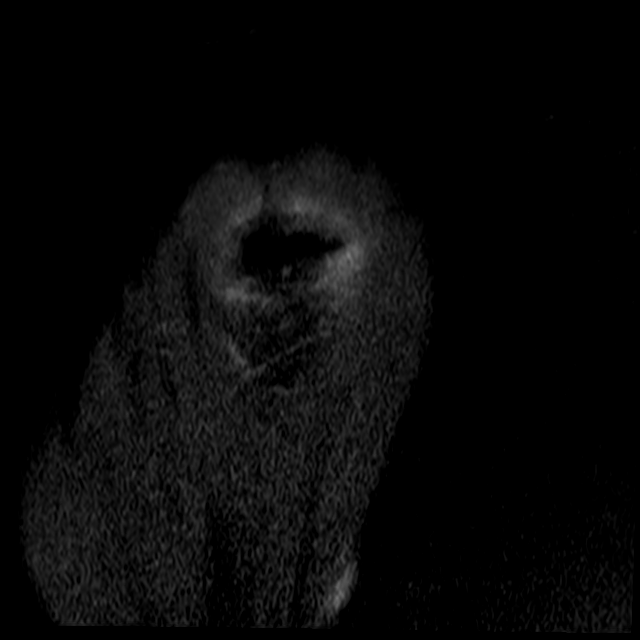

[Series 8: PD · oblique · right · 4.0mm · 0.44mm/px · 7 of 20 slices shown]
[im 1/20]
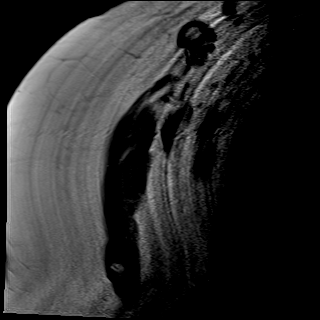
[im 4/20]
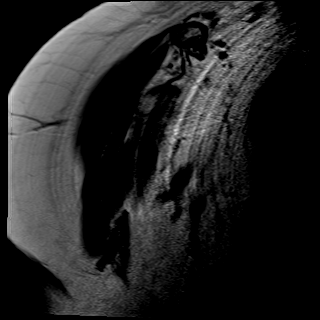
[im 7/20]
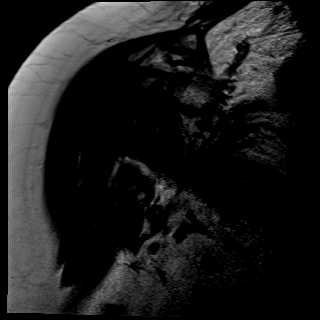
[im 10/20]
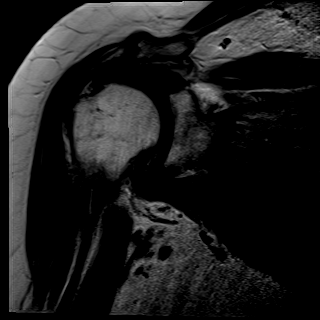
[im 13/20]
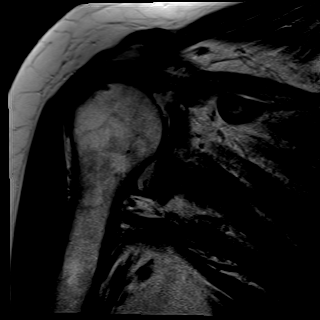
[im 16/20]
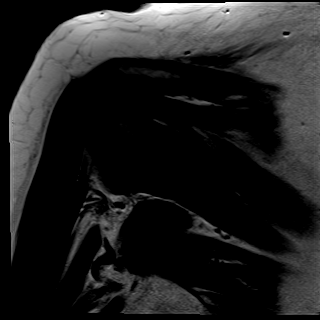
[im 20/20]
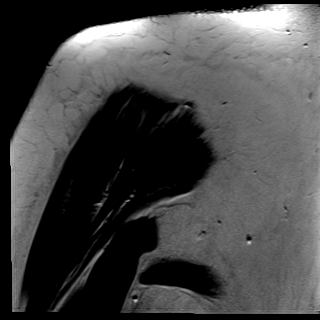

[Series 9: T2 fat-sat · oblique · right · 4.0mm · 0.44mm/px · 3 of 20 slices shown (3 of 3)]
[im 4/20]
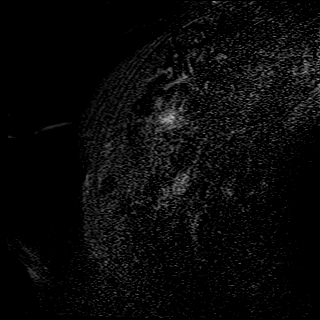
[im 10/20]
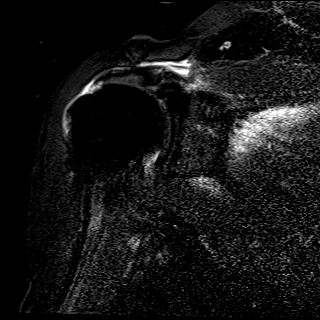
[im 16/20]
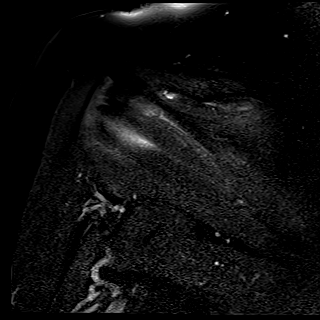

[25 of 40 positions shown; findings below may reference images not displayed]

FINDINGS: Rotator cuff: Severe supraspinatus tendinosis with full-thickness,
partial width tear at the insertion measuring 8 mm in AP dimension
with 9 mm retraction. Moderate infraspinatus tendinosis with focal
high-grade partial-thickness articular surface tear of the distal
tendon. High-grade partial-thickness articular surface tear of the
distal subscapularis tendon. The teres minor tendon is intact.

Muscles: Mild supraspinatus, infraspinatus, and subscapularis muscle
edema and fatty infiltration without frank atrophy.

Biceps long head:  Completely torn and retracted distally.

Acromioclavicular Joint: Mild arthropathy of the acromioclavicular
joint. Type II acromion. Small amount of fluid in the
subacromial/subdeltoid bursa.

Glenohumeral Joint: Small joint effusion.  No chondral defect.

Labrum:  Grossly intact.

Bones: No acute fracture or dislocation. No suspicious bone lesion.

Other: None.
IMPRESSION: 1. Severe supraspinatus tendinosis with full-thickness, partial
width tear at the insertion.
2. Moderate infraspinatus tendinosis with focal high-grade
partial-thickness articular surface tear of the distal tendon.
3. High-grade partial-thickness articular surface tear of the distal
subscapularis tendon.
4. Completely torn and retracted long head of the biceps tendon.
5. Mild acromioclavicular osteoarthritis.

## 2022-12-28 ENCOUNTER — Other Ambulatory Visit: Payer: Self-pay

## 2022-12-28 ENCOUNTER — Encounter: Payer: Self-pay | Admitting: Ophthalmology

## 2023-01-07 NOTE — Discharge Instructions (Signed)

## 2023-01-09 ENCOUNTER — Other Ambulatory Visit: Payer: Self-pay

## 2023-01-09 ENCOUNTER — Ambulatory Visit
Admission: RE | Admit: 2023-01-09 | Discharge: 2023-01-09 | Disposition: A | Discharge status: Home / Self Care | Payer: Medicare Other | Attending: Ophthalmology | Admitting: Ophthalmology

## 2023-01-09 ENCOUNTER — Encounter
Admission: RE | Disposition: A | Discharge status: Home / Self Care | Payer: Self-pay | Source: Home / Self Care | Attending: Ophthalmology

## 2023-01-09 ENCOUNTER — Ambulatory Visit: Payer: Medicare Other | Admitting: Anesthesiology

## 2023-01-09 ENCOUNTER — Encounter: Payer: Self-pay | Admitting: Ophthalmology

## 2023-01-09 DIAGNOSIS — E669 Obesity, unspecified: Secondary | ICD-10-CM | POA: Insufficient documentation

## 2023-01-09 DIAGNOSIS — M1990 Unspecified osteoarthritis, unspecified site: Secondary | ICD-10-CM | POA: Insufficient documentation

## 2023-01-09 DIAGNOSIS — E1136 Type 2 diabetes mellitus with diabetic cataract: Secondary | ICD-10-CM | POA: Diagnosis present

## 2023-01-09 DIAGNOSIS — Z7984 Long term (current) use of oral hypoglycemic drugs: Secondary | ICD-10-CM | POA: Insufficient documentation

## 2023-01-09 DIAGNOSIS — H2511 Age-related nuclear cataract, right eye: Secondary | ICD-10-CM | POA: Diagnosis not present

## 2023-01-09 DIAGNOSIS — I10 Essential (primary) hypertension: Secondary | ICD-10-CM | POA: Insufficient documentation

## 2023-01-09 DIAGNOSIS — K219 Gastro-esophageal reflux disease without esophagitis: Secondary | ICD-10-CM | POA: Diagnosis not present

## 2023-01-09 DIAGNOSIS — Z6834 Body mass index (BMI) 34.0-34.9, adult: Secondary | ICD-10-CM | POA: Diagnosis not present

## 2023-01-09 DIAGNOSIS — E1140 Type 2 diabetes mellitus with diabetic neuropathy, unspecified: Secondary | ICD-10-CM | POA: Diagnosis not present

## 2023-01-09 DIAGNOSIS — E119 Type 2 diabetes mellitus without complications: Secondary | ICD-10-CM

## 2023-01-09 HISTORY — PX: CATARACT EXTRACTION W/PHACO: SHX586

## 2023-01-09 LAB — GLUCOSE, CAPILLARY: Glucose-Capillary: 216 mg/dL — ABNORMAL HIGH (ref 70–99)

## 2023-01-09 SURGERY — PHACOEMULSIFICATION, CATARACT, WITH IOL INSERTION
Anesthesia: Monitor Anesthesia Care | Site: Eye | Laterality: Right

## 2023-01-09 MED ORDER — TETRACAINE HCL 0.5 % OP SOLN
1.0000 [drp] | OPHTHALMIC | Status: DC | PRN
  Administered 2023-01-09 (×3): 1 [drp] via OPHTHALMIC

## 2023-01-09 MED ORDER — CEFUROXIME OPHTHALMIC INJECTION 1 MG/0.1 ML
INJECTION | OPHTHALMIC | Status: DC | PRN
  Administered 2023-01-09: .1 mL via INTRACAMERAL

## 2023-01-09 MED ORDER — ARMC OPHTHALMIC DILATING DROPS
1.0000 | OPHTHALMIC | Status: DC | PRN
  Administered 2023-01-09 (×3): 1 via OPHTHALMIC

## 2023-01-09 MED ORDER — BRIMONIDINE TARTRATE-TIMOLOL 0.2-0.5 % OP SOLN
OPHTHALMIC | Status: DC | PRN
  Administered 2023-01-09: 1 [drp] via OPHTHALMIC

## 2023-01-09 MED ORDER — SIGHTPATH DOSE#1 NA HYALUR & NA CHOND-NA HYALUR IO KIT
PACK | INTRAOCULAR | Status: DC | PRN
  Administered 2023-01-09: 1 via OPHTHALMIC

## 2023-01-09 MED ORDER — LACTATED RINGERS IV SOLN: INTRAVENOUS | Status: DC

## 2023-01-09 MED ORDER — SIGHTPATH DOSE#1 BSS IO SOLN
INTRAOCULAR | Status: DC | PRN
  Administered 2023-01-09: 1 mL via INTRAMUSCULAR

## 2023-01-09 MED ORDER — ACETAMINOPHEN 325 MG PO TABS: 650.0000 mg | ORAL_TABLET | Freq: Once | ORAL | Status: DC | PRN

## 2023-01-09 MED ORDER — SIGHTPATH DOSE#1 BSS IO SOLN
INTRAOCULAR | Status: DC | PRN
  Administered 2023-01-09: 54 mL via OPHTHALMIC

## 2023-01-09 MED ORDER — ACETAMINOPHEN 160 MG/5ML PO SOLN: 325.0000 mg | ORAL | Status: DC | PRN

## 2023-01-09 MED ORDER — ONDANSETRON HCL 4 MG/2ML IJ SOLN: 4.0000 mg | Freq: Once | INTRAMUSCULAR | Status: DC | PRN

## 2023-01-09 MED ORDER — MIDAZOLAM HCL 2 MG/2ML IJ SOLN
INTRAMUSCULAR | Status: DC | PRN
  Administered 2023-01-09 (×2): 1 mg via INTRAVENOUS

## 2023-01-09 MED ORDER — SODIUM CHLORIDE 0.9% FLUSH
INTRAVENOUS | Status: DC | PRN
  Administered 2023-01-09: 10 mL via INTRAVENOUS

## 2023-01-09 MED ORDER — SIGHTPATH DOSE#1 BSS IO SOLN
INTRAOCULAR | Status: DC | PRN
  Administered 2023-01-09: 15 mL

## 2023-01-09 SURGICAL SUPPLY — 10 items
CATARACT SUITE SIGHTPATH (MISCELLANEOUS) ×1 IMPLANT
FEE CATARACT SUITE SIGHTPATH (MISCELLANEOUS) ×1 IMPLANT
GLOVE SRG 8 PF TXTR STRL LF DI (GLOVE) ×1 IMPLANT
GLOVE SURG ENC TEXT LTX SZ7.5 (GLOVE) ×1 IMPLANT
GLOVE SURG UNDER POLY LF SZ8 (GLOVE) ×1
LENS IOL TECNIS EYHANCE 20.0 (Intraocular Lens) IMPLANT
NDL FILTER BLUNT 18X1 1/2 (NEEDLE) ×1 IMPLANT
NEEDLE FILTER BLUNT 18X1 1/2 (NEEDLE) ×1 IMPLANT
SYR 3ML LL SCALE MARK (SYRINGE) ×1 IMPLANT
WATER STERILE IRR 250ML POUR (IV SOLUTION) ×1 IMPLANT

## 2023-01-09 NOTE — H&P (Signed)
[place]   Primary Care Physician:  Rusty Aus, MD Ophthalmologist: Dr. Leandrew Koyanagi  Pre-Procedure History & Physical: HPI:  Jamie Guzman is a 72 y.o. female here for ophthalmic surgery.   Past Medical History:  Diagnosis Date   Arthritis    Complication of anesthesia    nausea and vomiting "no gas"   Diabetes mellitus without complication ([place])    Hypertension    Neuropathy     Past Surgical History:  Procedure Laterality Date   CHOLECYSTECTOMY     HYSTEROSCOPY WITH D & C N/A 09/18/2017   Procedure: DILATATION AND CURETTAGE /HYSTEROSCOPY, POLYPECTOMY;  Surgeon: Ward, Honor Loh, MD;  Location: [facility];  Service: Gynecology;  Laterality: N/A;   SHOULDER ARTHROSCOPY     THUMB ARTHROSCOPY      Prior to Admission medications   Medication Sig Start Date End Date Taking? Authorizing Provider  carbidopa-levodopa (SINEMET IR) 10-100 MG tablet Take 2 tablets at bedtime by mouth.   Yes [provider]  Cetirizine HCl (ZYRTEC ALLERGY PO) Take 1 capsule daily by mouth.   Yes [provider]  fluticasone (FLONASE) 50 MCG/ACT nasal spray Place 1 spray at bedtime into both nostrils.   Yes [provider]  glipiZIDE (GLUCOTROL) 10 MG tablet Take 10 mg 2 (two) times daily before a meal by mouth.   Yes [provider]  lisinopril (PRINIVIL,ZESTRIL) 10 MG tablet Take 10 mg at bedtime by mouth.   Yes [provider]  metFORMIN (GLUCOPHAGE) 500 MG tablet Take 1,000 mg 2 (two) times daily with a meal by mouth.   Yes [provider]  NON FORMULARY Take 1 capsule 2 (two) times daily by mouth. cholestopol   Yes [provider]  omeprazole (PRILOSEC) 20 MG capsule Take 20 mg daily by mouth.   Yes [provider]  pregabalin (LYRICA) 100 MG capsule Take 100 mg at bedtime by mouth.   Yes [provider]  pseudoephedrine (SUDAFED) 30 MG tablet Take 60 mg every morning by mouth.   Yes  [provider]  torsemide (DEMADEX) 20 MG tablet Take by mouth. 10/05/19  Yes [provider]  hydrochlorothiazide (MICROZIDE) 12.5 MG capsule Take 12.5 mg daily as needed by mouth. Patient not taking: Reported on 12/28/2022    [provider]    Allergies as of 12/20/2022 - Review Complete 02/25/2020  Allergen Reaction Noted   Codeine Nausea And Vomiting 09/09/2017   Demerol [meperidine] Nausea And Vomiting 09/18/2017   Iodine  09/18/2017   Lasix [furosemide] Other (See Comments) 09/09/2017   Sulfa antibiotics Other (See Comments) 09/09/2017   Adhesive [tape] Rash 09/18/2017   Latex Rash 09/18/2017    History reviewed. No pertinent family history.  Social History   Socioeconomic History   Marital status: Divorced    Spouse name: Not on file   Number of children: Not on file   Years of education: Not on file   Highest education level: Not on file  Occupational History   Not on file  Tobacco Use   Smoking status: Never   Smokeless tobacco: Never  Vaping Use   Vaping Use: Not on file  Substance and Sexual Activity   Alcohol use: No   Drug use: No   Sexual activity: Not on file  Other Topics Concern   Not on file  Social History Narrative   Not on file   Social Determinants of Health   Financial Resource Strain: Not on file  Food  Insecurity: Not on file  Transportation Needs: Not on file  Physical Activity: Not on file  Stress: Not on file  Social Connections: Not on file  Intimate Partner Violence: Not on file    Review of Systems: See HPI, otherwise negative ROS  Physical Exam: BP 121/80   Pulse 95   Temp 97.9 F (36.6 C)   Ht '5\' 3"'$  (1.6 m)   Wt 88 kg   SpO2 100%   BMI 34.37 kg/m  General:   Alert,  pleasant and cooperative in NAD Head:  Normocephalic and atraumatic. Lungs:  Clear to auscultation.    Heart:  Regular rate and rhythm.   Impression/Plan: [name] is here for ophthalmic surgery.  Risks,  benefits, limitations, and alternatives regarding ophthalmic surgery have been reviewed with the patient.  Questions have been answered.  All parties agreeable.   Leandrew Koyanagi, MD  01/09/2023, 8:02 AM

## 2023-01-09 NOTE — Op Note (Signed)
LOCATION:  [place]   PREOPERATIVE DIAGNOSIS:    Nuclear sclerotic cataract right eye. H25.11   POSTOPERATIVE DIAGNOSIS:  Nuclear sclerotic cataract right eye.     PROCEDURE:  Phacoemusification with posterior chamber intraocular lens placement of the right eye   ULTRASOUND TIME: Procedure(s): CATARACT EXTRACTION PHACO AND INTRAOCULAR LENS PLACEMENT (IOC) RIGHT  5.83  00:46.1 (Right)  LENS:   Implant Name Type Inv. Item Serial No. Manufacturer Lot No. LRB No. Used Action  LENS IOL TECNIS EYHANCE 20.0 - [id number] Intraocular Lens LENS IOL TECNIS EYHANCE 20.0 [id number] [employer]  Right 1 Implanted         SURGEON:  Wyonia Hough, MD   ANESTHESIA:  Topical with tetracaine drops and 2% Xylocaine jelly, augmented with 1% preservative-free intracameral lidocaine.    COMPLICATIONS:  None.   DESCRIPTION OF PROCEDURE:  The patient was identified in the holding room and transported to the operating room and placed in the supine position under the operating microscope.  The right eye was identified as the operative eye and it was prepped and draped in the usual sterile ophthalmic fashion.   A 1 millimeter clear-corneal paracentesis was made at the 12:00 position.  0.5 ml of preservative-free 1% lidocaine was injected into the anterior chamber. The anterior chamber was filled with Viscoat viscoelastic.  A 2.4 millimeter keratome was used to make a near-clear corneal incision at the 9:00 position.  A curvilinear capsulorrhexis was made with a cystotome and capsulorrhexis forceps.  Balanced salt solution was used to hydrodissect and hydrodelineate the nucleus.   Phacoemulsification was then used in stop and chop fashion to remove the lens nucleus and epinucleus.  The remaining cortex was then removed using the irrigation and aspiration handpiece. Provisc was then placed into the capsular bag to distend it for lens placement.  A lens was then injected into the capsular bag.   The remaining viscoelastic was aspirated.   Wounds were hydrated with balanced salt solution.  The anterior chamber was inflated to a physiologic pressure with balanced salt solution.  No wound leaks were noted. Cefuroxime 0.1 ml of a '10mg'$ /ml solution was injected into the anterior chamber for a dose of 1 mg of intracameral antibiotic at the completion of the case.   Timolol and Brimonidine drops were applied to the eye.  The patient was taken to the recovery room in stable condition without complications of anesthesia or surgery.   Piotr Christopher 01/09/2023, 8:43 AM

## 2023-01-09 NOTE — Anesthesia Procedure Notes (Signed)
Procedure Name: MAC Date/Time: 01/09/2023 8:28 AM  Performed by: Hilbert Odor, CRNAPre-anesthesia Checklist: Patient identified, Emergency Drugs available, Suction available, Patient being monitored and Timeout performed Patient Re-evaluated:Patient Re-evaluated prior to induction Oxygen Delivery Method: Nasal cannula Preoxygenation: Pre-oxygenation with 100% oxygen Induction Type: IV induction

## 2023-01-09 NOTE — Transfer of Care (Addendum)
Immediate Anesthesia Transfer of Care Note  Patient: Jamie Guzman  Procedure(s) Performed: CATARACT EXTRACTION PHACO AND INTRAOCULAR LENS PLACEMENT (IOC) RIGHT  5.83  00:46.1 (Right: Eye)  Patient Location: PACU  Anesthesia Type: MAC  Level of Consciousness: awake, alert  and patient cooperative  Airway and Oxygen Therapy: Patient Spontanous Breathing and Patient connected to supplemental oxygen  Post-op Assessment: Post-op Vital signs reviewed, Patient's Cardiovascular Status Stable, Respiratory Function Stable, Patent Airway and No signs of Nausea or vomiting  Post-op Vital Signs: Reviewed and stable  Complications: No notable events documented.

## 2023-01-09 NOTE — Anesthesia Postprocedure Evaluation (Signed)
Anesthesia Post Note  Patient: Jamie Guzman  Procedure(s) Performed: CATARACT EXTRACTION PHACO AND INTRAOCULAR LENS PLACEMENT (IOC) RIGHT  5.83  00:46.1 (Right: Eye)  Patient location during evaluation: PACU Anesthesia Type: MAC Level of consciousness: awake and alert, oriented and patient cooperative Pain management: pain level controlled Vital Signs Assessment: post-procedure vital signs reviewed and stable Respiratory status: spontaneous breathing, nonlabored ventilation and respiratory function stable Cardiovascular status: blood pressure returned to baseline and stable Postop Assessment: adequate PO intake Anesthetic complications: no   No notable events documented.   Last Vitals:  Vitals:   01/09/23 0845 01/09/23 0851  BP: 114/62 134/78  Pulse: 92 89  Resp: 11 14  Temp: (!) 36.1 C (!) 36.1 C  SpO2: 97% 98%    Last Pain:  Vitals:   01/09/23 0851  PainSc: 0-No pain                 Darrin Nipper

## 2023-01-09 NOTE — Anesthesia Preprocedure Evaluation (Addendum)
Anesthesia Evaluation  Patient identified by MRN, date of birth, ID band Patient awake    Reviewed: Allergy & Precautions, NPO status , Patient's Chart, lab work & pertinent test results  History of Anesthesia Complications (+) PONV and history of anesthetic complications  Airway Mallampati: IV   Neck ROM: Full    Dental  (+) Missing, Chipped   Pulmonary neg pulmonary ROS   Pulmonary exam normal breath sounds clear to auscultation       Cardiovascular hypertension, Normal cardiovascular exam Rhythm:Regular Rate:Normal     Neuro/Psych  Neuromuscular disease (neuropathy)    GI/Hepatic ,GERD  ,,  Endo/Other  diabetes, Type 2  Obesity   Renal/GU negative Renal ROS     Musculoskeletal  (+) Arthritis ,  Gout    Abdominal   Peds  Hematology negative hematology ROS (+)   Anesthesia Other Findings   Reproductive/Obstetrics                             Anesthesia Physical Anesthesia Plan  ASA: 2  Anesthesia Plan: MAC   Post-op Pain Management:    Induction: Intravenous  PONV Risk Score and Plan: 3 and Treatment may vary due to age or medical condition, Midazolam and TIVA  Airway Management Planned: Natural Airway and Nasal Cannula  Additional Equipment:   Intra-op Plan:   Post-operative Plan:   Informed Consent: I have reviewed the patients History and Physical, chart, labs and discussed the procedure including the risks, benefits and alternatives for the proposed anesthesia with the patient or authorized representative who has indicated his/her understanding and acceptance.     Dental advisory given  Plan Discussed with: CRNA  Anesthesia Plan Comments: (LMA/GETA backup discussed.  Patient consented for risks of anesthesia including but not limited to:  - adverse reactions to medications - damage to eyes, teeth, lips or other oral mucosa - nerve damage due to positioning  -  sore throat or hoarseness - damage to heart, brain, nerves, lungs, other parts of body or loss of life  Informed patient about role of CRNA in peri- and intra-operative care.  Patient voiced understanding.)        Anesthesia Quick Evaluation

## 2023-01-10 ENCOUNTER — Encounter: Payer: Self-pay | Admitting: Ophthalmology

## 2023-01-22 NOTE — Discharge Instructions (Signed)

## 2023-01-23 ENCOUNTER — Ambulatory Visit: Payer: Medicare Other | Admitting: Anesthesiology

## 2023-01-23 ENCOUNTER — Encounter
Admission: RE | Disposition: A | Discharge status: Home / Self Care | Payer: Self-pay | Source: Home / Self Care | Attending: Ophthalmology

## 2023-01-23 ENCOUNTER — Other Ambulatory Visit: Payer: Self-pay

## 2023-01-23 ENCOUNTER — Encounter: Payer: Self-pay | Admitting: Ophthalmology

## 2023-01-23 ENCOUNTER — Ambulatory Visit
Admission: RE | Admit: 2023-01-23 | Discharge: 2023-01-23 | Disposition: A | Discharge status: Home / Self Care | Payer: Medicare Other | Attending: Ophthalmology | Admitting: Ophthalmology

## 2023-01-23 DIAGNOSIS — H2512 Age-related nuclear cataract, left eye: Secondary | ICD-10-CM | POA: Insufficient documentation

## 2023-01-23 DIAGNOSIS — E1136 Type 2 diabetes mellitus with diabetic cataract: Secondary | ICD-10-CM | POA: Insufficient documentation

## 2023-01-23 DIAGNOSIS — E1142 Type 2 diabetes mellitus with diabetic polyneuropathy: Secondary | ICD-10-CM | POA: Insufficient documentation

## 2023-01-23 DIAGNOSIS — Z7984 Long term (current) use of oral hypoglycemic drugs: Secondary | ICD-10-CM | POA: Insufficient documentation

## 2023-01-23 DIAGNOSIS — I10 Essential (primary) hypertension: Secondary | ICD-10-CM | POA: Diagnosis not present

## 2023-01-23 DIAGNOSIS — K219 Gastro-esophageal reflux disease without esophagitis: Secondary | ICD-10-CM | POA: Diagnosis not present

## 2023-01-23 HISTORY — PX: CATARACT EXTRACTION W/PHACO: SHX586

## 2023-01-23 LAB — GLUCOSE, CAPILLARY: Glucose-Capillary: 176 mg/dL — ABNORMAL HIGH (ref 70–99)

## 2023-01-23 SURGERY — PHACOEMULSIFICATION, CATARACT, WITH IOL INSERTION
Anesthesia: Monitor Anesthesia Care | Site: Eye | Laterality: Left

## 2023-01-23 MED ORDER — BRIMONIDINE TARTRATE-TIMOLOL 0.2-0.5 % OP SOLN
OPHTHALMIC | Status: DC | PRN
  Administered 2023-01-23: 1 [drp] via OPHTHALMIC

## 2023-01-23 MED ORDER — CEFUROXIME OPHTHALMIC INJECTION 1 MG/0.1 ML
INJECTION | OPHTHALMIC | Status: DC | PRN
  Administered 2023-01-23: .1 mL via INTRACAMERAL

## 2023-01-23 MED ORDER — ARMC OPHTHALMIC DILATING DROPS
1.0000 | OPHTHALMIC | Status: DC | PRN
  Administered 2023-01-23 (×3): 1 via OPHTHALMIC

## 2023-01-23 MED ORDER — SIGHTPATH DOSE#1 NA HYALUR & NA CHOND-NA HYALUR IO KIT
PACK | INTRAOCULAR | Status: DC | PRN
  Administered 2023-01-23: 1 via OPHTHALMIC

## 2023-01-23 MED ORDER — FENTANYL CITRATE (PF) 100 MCG/2ML IJ SOLN
INTRAMUSCULAR | Status: DC | PRN
  Administered 2023-01-23: 50 ug via INTRAVENOUS

## 2023-01-23 MED ORDER — MIDAZOLAM HCL 2 MG/2ML IJ SOLN
INTRAMUSCULAR | Status: DC | PRN
  Administered 2023-01-23 (×2): 1 mg via INTRAVENOUS

## 2023-01-23 MED ORDER — SIGHTPATH DOSE#1 BSS IO SOLN
INTRAOCULAR | Status: DC | PRN
  Administered 2023-01-23: 1 mL

## 2023-01-23 MED ORDER — SIGHTPATH DOSE#1 BSS IO SOLN
INTRAOCULAR | Status: DC | PRN
  Administered 2023-01-23: 79 mL via OPHTHALMIC

## 2023-01-23 MED ORDER — ONDANSETRON HCL 4 MG/2ML IJ SOLN
INTRAMUSCULAR | Status: DC | PRN
  Administered 2023-01-23: 4 mg via INTRAVENOUS

## 2023-01-23 MED ORDER — SIGHTPATH DOSE#1 BSS IO SOLN
INTRAOCULAR | Status: DC | PRN
  Administered 2023-01-23: 15 mL

## 2023-01-23 MED ORDER — TETRACAINE HCL 0.5 % OP SOLN
1.0000 [drp] | OPHTHALMIC | Status: DC | PRN
  Administered 2023-01-23 (×3): 1 [drp] via OPHTHALMIC

## 2023-01-23 SURGICAL SUPPLY — 20 items
CANNULA ANT/CHMB 27G (MISCELLANEOUS) IMPLANT
CANNULA ANT/CHMB 27GA (MISCELLANEOUS) IMPLANT
CATARACT SUITE SIGHTPATH (MISCELLANEOUS) ×1 IMPLANT
FEE CATARACT SUITE SIGHTPATH (MISCELLANEOUS) ×1 IMPLANT
GLOVE SRG 8 PF TXTR STRL LF DI (GLOVE) ×1 IMPLANT
GLOVE SURG ENC TEXT LTX SZ7.5 (GLOVE) ×1 IMPLANT
GLOVE SURG GAMMEX PI TX LF 7.5 (GLOVE) IMPLANT
GLOVE SURG UNDER POLY LF SZ8 (GLOVE) ×1
LENS IOL TECNIS EYHANCE 20.0 (Intraocular Lens) IMPLANT
NDL FILTER BLUNT 18X1 1/2 (NEEDLE) ×1 IMPLANT
NDL RETROBULBAR .5 NSTRL (NEEDLE) IMPLANT
NEEDLE FILTER BLUNT 18X1 1/2 (NEEDLE) ×1 IMPLANT
PACK VIT ANT 23G (MISCELLANEOUS) IMPLANT
RING MALYGIN 7.0 (MISCELLANEOUS) IMPLANT
SUT ETHILON 10-0 CS-B-6CS-B-6 (SUTURE)
SUT VICRYL  9 0 (SUTURE)
SUT VICRYL 9 0 (SUTURE) IMPLANT
SUTURE EHLN 10-0 CS-B-6CS-B-6 (SUTURE) IMPLANT
SYR 3ML LL SCALE MARK (SYRINGE) ×1 IMPLANT
WATER STERILE IRR 250ML POUR (IV SOLUTION) ×1 IMPLANT

## 2023-01-23 NOTE — H&P (Signed)
[place]   Primary Care Physician:  Rusty Aus, MD Ophthalmologist: Dr. Leandrew Koyanagi  Pre-Procedure History & Physical: HPI:  Jamie Guzman is a 72 y.o. female here for ophthalmic surgery.   Past Medical History:  Diagnosis Date   Arthritis    Complication of anesthesia    nausea and vomiting "no gas"   Diabetes mellitus without complication ([place])    Hypertension    Neuropathy     Past Surgical History:  Procedure Laterality Date   CATARACT EXTRACTION W/PHACO Right 01/09/2023   Procedure: CATARACT EXTRACTION PHACO AND INTRAOCULAR LENS PLACEMENT (IOC) RIGHT  5.83  00:46.1;  Surgeon: Leandrew Koyanagi, MD;  Location: [place];  Service: Ophthalmology;  Laterality: Right;   CHOLECYSTECTOMY     HYSTEROSCOPY WITH D & C N/A 09/18/2017   Procedure: DILATATION AND CURETTAGE /HYSTEROSCOPY, POLYPECTOMY;  Surgeon: Ward, Honor Loh, MD;  Location: [facility];  Service: Gynecology;  Laterality: N/A;   SHOULDER ARTHROSCOPY     THUMB ARTHROSCOPY      Prior to Admission medications   Medication Sig Start Date End Date Taking? Authorizing Provider  carbidopa-levodopa (SINEMET IR) 10-100 MG tablet Take 2 tablets at bedtime by mouth.   Yes [provider]  Cetirizine HCl (ZYRTEC ALLERGY PO) Take 1 capsule daily by mouth.   Yes [provider]  fluticasone (FLONASE) 50 MCG/ACT nasal spray Place 1 spray at bedtime into both nostrils.   Yes [provider]  glipiZIDE (GLUCOTROL) 10 MG tablet Take 10 mg 2 (two) times daily before a meal by mouth.   Yes [provider]  lisinopril (PRINIVIL,ZESTRIL) 10 MG tablet Take 10 mg at bedtime by mouth.   Yes [provider]  metFORMIN (GLUCOPHAGE) 500 MG tablet Take 1,000 mg 2 (two) times daily with a meal by mouth.   Yes [provider]  NON FORMULARY Take 1 capsule 2 (two) times daily by mouth. cholestopol   Yes [provider]  omeprazole (PRILOSEC) 20  MG capsule Take 20 mg daily by mouth.   Yes [provider]  pregabalin (LYRICA) 100 MG capsule Take 100 mg at bedtime by mouth.   Yes [provider]  pseudoephedrine (SUDAFED) 30 MG tablet Take 60 mg every morning by mouth.   Yes [provider]  torsemide (DEMADEX) 20 MG tablet Take by mouth. 10/05/19  Yes [provider]  hydrochlorothiazide (MICROZIDE) 12.5 MG capsule Take 12.5 mg daily as needed by mouth. Patient not taking: Reported on 12/28/2022    [provider]    Allergies as of 12/20/2022 - Review Complete 02/25/2020  Allergen Reaction Noted   Codeine Nausea And Vomiting 09/09/2017   Demerol [meperidine] Nausea And Vomiting 09/18/2017   Iodine  09/18/2017   Lasix [furosemide] Other (See Comments) 09/09/2017   Sulfa antibiotics Other (See Comments) 09/09/2017   Adhesive [tape] Rash 09/18/2017   Latex Rash 09/18/2017    History reviewed. No pertinent family history.  Social History   Socioeconomic History   Marital status: Divorced    Spouse name: Not on file   Number of children: Not on file   Years of education: Not on file   Highest education level: Not on file  Occupational History   Not on file  Tobacco Use   Smoking status: Never   Smokeless tobacco: Never  Vaping Use   Vaping Use: Not on file  Substance and Sexual Activity   Alcohol use: No   Drug use: No  Sexual activity: Not on file  Other Topics Concern   Not on file  Social History Narrative   Not on file   Social Determinants of Health   Financial Resource Strain: Not on file  Food Insecurity: Not on file  Transportation Needs: Not on file  Physical Activity: Not on file  Stress: Not on file  Social Connections: Not on file  Intimate Partner Violence: Not on file    Review of Systems: See HPI, otherwise negative ROS  Physical Exam: BP 118/66   Pulse 96   Temp (!) 97.3 F (36.3 C) (Temporal)   Resp 14   Ht 5\' 3"  (1.6 m)   Wt 87.2 kg    SpO2 95%   BMI 34.05 kg/m  General:   Alert,  pleasant and cooperative in NAD Head:  Normocephalic and atraumatic. Lungs:  Clear to auscultation.    Heart:  Regular rate and rhythm.   Impression/Plan: Jamie Guzman is here for ophthalmic surgery.  Risks, benefits, limitations, and alternatives regarding ophthalmic surgery have been reviewed with the patient.  Questions have been answered.  All parties agreeable.   Leandrew Koyanagi, MD  01/23/2023, 7:30 AM

## 2023-01-23 NOTE — Anesthesia Preprocedure Evaluation (Signed)
Anesthesia Evaluation  Patient identified by MRN, date of birth, ID band Patient awake    Reviewed: Allergy & Precautions, NPO status , Patient's Chart, lab work & pertinent test results  History of Anesthesia Complications (+) PONV and history of anesthetic complications  Airway Mallampati: IV   Neck ROM: Full    Dental  (+) Missing, Chipped   Pulmonary neg pulmonary ROS   Pulmonary exam normal breath sounds clear to auscultation       Cardiovascular hypertension, Normal cardiovascular exam Rhythm:Regular Rate:Normal     Neuro/Psych  Neuromuscular disease (neuropathy)    GI/Hepatic ,GERD  ,,  Endo/Other  diabetes, Type 2  Obesity   Renal/GU negative Renal ROS     Musculoskeletal  (+) Arthritis ,  Gout    Abdominal   Peds  Hematology negative hematology ROS (+)   Anesthesia Other Findings   Reproductive/Obstetrics                             Anesthesia Physical Anesthesia Plan  ASA: 2  Anesthesia Plan: MAC   Post-op Pain Management:    Induction: Intravenous  PONV Risk Score and Plan: 3 and Treatment may vary due to age or medical condition, Midazolam and TIVA  Airway Management Planned: Natural Airway and Nasal Cannula  Additional Equipment:   Intra-op Plan:   Post-operative Plan:   Informed Consent: I have reviewed the patients History and Physical, chart, labs and discussed the procedure including the risks, benefits and alternatives for the proposed anesthesia with the patient or authorized representative who has indicated his/her understanding and acceptance.     Dental advisory given  Plan Discussed with: CRNA  Anesthesia Plan Comments: (LMA/GETA backup discussed.  Patient consented for risks of anesthesia including but not limited to:  - adverse reactions to medications - damage to eyes, teeth, lips or other oral mucosa - nerve damage due to positioning  -  sore throat or hoarseness - damage to heart, brain, nerves, lungs, other parts of body or loss of life  Informed patient about role of CRNA in peri- and intra-operative care.  Patient voiced understanding.)        Anesthesia Quick Evaluation  

## 2023-01-23 NOTE — Transfer of Care (Signed)
Immediate Anesthesia Transfer of Care Note  Patient: Jamie Guzman  Procedure(s) Performed: CATARACT EXTRACTION PHACO AND INTRAOCULAR LENS PLACEMENT (IOC) LEFT DIABETIC (Left: Eye)  Patient Location: PACU  Anesthesia Type: MAC  Level of Consciousness: awake, alert  and patient cooperative  Airway and Oxygen Therapy: Patient Spontanous Breathing and Patient connected to supplemental oxygen  Post-op Assessment: Post-op Vital signs reviewed, Patient's Cardiovascular Status Stable, Respiratory Function Stable, Patent Airway and No signs of Nausea or vomiting  Post-op Vital Signs: Reviewed and stable  Complications: No notable events documented.

## 2023-01-23 NOTE — Op Note (Signed)
OPERATIVE NOTE  Jamie Guzman [id number] 01/23/2023   PREOPERATIVE DIAGNOSIS:  Nuclear sclerotic cataract left eye. H25.12   POSTOPERATIVE DIAGNOSIS:    Nuclear sclerotic cataract left eye.     PROCEDURE:  Phacoemusification with posterior chamber intraocular lens placement of the left eye  Ultrasound time: Procedure(s) with comments: CATARACT EXTRACTION PHACO AND INTRAOCULAR LENS PLACEMENT (IOC) LEFT DIABETIC (Left) - 5.24 0:40.1  LENS:   Implant Name Type Inv. Item Serial No. Manufacturer Lot No. LRB No. Used Action  LENS IOL TECNIS EYHANCE 20.0 - [id number] Intraocular Lens LENS IOL TECNIS EYHANCE 20.0 [id number] [employer]  Left 1 Implanted      SURGEON:  Wyonia Hough, MD   ANESTHESIA:  Topical with tetracaine drops and 2% Xylocaine jelly, augmented with 1% preservative-free intracameral lidocaine.    COMPLICATIONS:  None.   DESCRIPTION OF PROCEDURE:  The patient was identified in the holding room and transported to the operating room and placed in the supine position under the operating microscope.  The left eye was identified as the operative eye and it was prepped and draped in the usual sterile ophthalmic fashion.   A 1 millimeter clear-corneal paracentesis was made at the 1:30 position.  0.5 ml of preservative-free 1% lidocaine was injected into the anterior chamber.  The anterior chamber was filled with Viscoat viscoelastic.  A 2.4 millimeter keratome was used to make a near-clear corneal incision at the 10:30 position.  .  A curvilinear capsulorrhexis was made with a cystotome and capsulorrhexis forceps.  Balanced salt solution was used to hydrodissect and hydrodelineate the nucleus.   Phacoemulsification was then used in stop and chop fashion to remove the lens nucleus and epinucleus.  The remaining cortex was then removed using the irrigation and aspiration handpiece. Provisc was then placed into the capsular bag to distend it for lens placement.  A  lens was then injected into the capsular bag.  The remaining viscoelastic was aspirated.   Wounds were hydrated with balanced salt solution.  The anterior chamber was inflated to a physiologic pressure with balanced salt solution.  No wound leaks were noted. Cefuroxime 0.1 ml of a 10mg /ml solution was injected into the anterior chamber for a dose of 1 mg of intracameral antibiotic at the completion of the case.   Timolol and Brimonidine drops were applied to the eye.  The patient was taken to the recovery room in stable condition without complications of anesthesia or surgery.  [name] 01/23/2023, 7:53 AM

## 2023-01-23 NOTE — Anesthesia Postprocedure Evaluation (Signed)
Anesthesia Post Note  Patient: Jamie Guzman  Procedure(s) Performed: CATARACT EXTRACTION PHACO AND INTRAOCULAR LENS PLACEMENT (IOC) LEFT DIABETIC (Left: Eye)  Patient location during evaluation: PACU Anesthesia Type: MAC Level of consciousness: awake and alert, oriented and patient cooperative Pain management: pain level controlled Vital Signs Assessment: post-procedure vital signs reviewed and stable Respiratory status: spontaneous breathing, nonlabored ventilation and respiratory function stable Cardiovascular status: blood pressure returned to baseline and stable Postop Assessment: adequate PO intake Anesthetic complications: no   No notable events documented.   Last Vitals:  Vitals:   01/23/23 0755 01/23/23 0801  BP: 119/71 122/79  Pulse: 91 90  Resp: 10 19  Temp: (!) 36 C (!) 36 C  SpO2: 98% 99%    Last Pain:  Vitals:   01/23/23 0801  TempSrc:   PainSc: 0-No pain                 Darrin Nipper

## 2024-07-28 ENCOUNTER — Other Ambulatory Visit: Payer: Self-pay | Admitting: Internal Medicine

## 2024-07-28 DIAGNOSIS — Z1231 Encounter for screening mammogram for malignant neoplasm of breast: Secondary | ICD-10-CM

## 2024-10-13 ENCOUNTER — Other Ambulatory Visit: Payer: Self-pay | Admitting: Family Medicine

## 2024-10-13 DIAGNOSIS — M5412 Radiculopathy, cervical region: Secondary | ICD-10-CM

## 2024-11-02 ENCOUNTER — Inpatient Hospital Stay
Admission: RE | Admit: 2024-11-02 | Discharge: 2024-11-02 | Disposition: A | Source: Ambulatory Visit | Attending: Family Medicine | Admitting: Family Medicine

## 2024-11-02 DIAGNOSIS — M5412 Radiculopathy, cervical region: Secondary | ICD-10-CM
